# Patient Record
Sex: Female | Born: 1945 | Race: White | Hispanic: Refuse to answer | Marital: Married | Smoking: Never smoker
Health system: Southern US, Community
[De-identification: ages and names within clinical notes are randomized; demographics above are authoritative.]

## PROBLEM LIST (undated history)

## (undated) DIAGNOSIS — I10 Essential (primary) hypertension: Secondary | ICD-10-CM

## (undated) DIAGNOSIS — E119 Type 2 diabetes mellitus without complications: Secondary | ICD-10-CM

## (undated) DIAGNOSIS — M5432 Sciatica, left side: Secondary | ICD-10-CM

## (undated) DIAGNOSIS — H409 Unspecified glaucoma: Secondary | ICD-10-CM

## (undated) DIAGNOSIS — R32 Unspecified urinary incontinence: Secondary | ICD-10-CM

## (undated) DIAGNOSIS — K2270 Barrett's esophagus without dysplasia: Secondary | ICD-10-CM

## (undated) HISTORY — DX: Unspecified urinary incontinence: R32

## (undated) HISTORY — DX: Barrett's esophagus without dysplasia: K22.70

## (undated) HISTORY — DX: Unspecified glaucoma: H40.9

## (undated) HISTORY — DX: Sciatica, left side: M54.32

## (undated) HISTORY — PX: ABDOMINAL HYSTERECTOMY: SHX81

---

## 1986-08-17 DIAGNOSIS — G4700 Insomnia, unspecified: Secondary | ICD-10-CM | POA: Insufficient documentation

## 1988-08-17 DIAGNOSIS — I10 Essential (primary) hypertension: Secondary | ICD-10-CM | POA: Insufficient documentation

## 1992-08-17 HISTORY — PX: ABDOMINAL HYSTERECTOMY: SHX81

## 1997-12-07 ENCOUNTER — Other Ambulatory Visit: Admission: RE | Admit: 1997-12-07 | Discharge: 1997-12-07 | Payer: Self-pay | Admitting: Obstetrics and Gynecology

## 1998-08-17 DIAGNOSIS — E1169 Type 2 diabetes mellitus with other specified complication: Secondary | ICD-10-CM | POA: Insufficient documentation

## 1999-01-06 ENCOUNTER — Other Ambulatory Visit: Admission: RE | Admit: 1999-01-06 | Discharge: 1999-01-06 | Payer: Self-pay | Admitting: Obstetrics and Gynecology

## 1999-02-04 ENCOUNTER — Encounter: Admission: RE | Admit: 1999-02-04 | Discharge: 1999-05-05 | Payer: Self-pay | Admitting: Cardiology

## 1999-06-12 ENCOUNTER — Encounter: Payer: Self-pay | Admitting: Obstetrics and Gynecology

## 1999-06-12 ENCOUNTER — Encounter: Admission: RE | Admit: 1999-06-12 | Discharge: 1999-06-12 | Payer: Self-pay | Admitting: Obstetrics and Gynecology

## 1999-06-19 ENCOUNTER — Encounter: Payer: Self-pay | Admitting: Obstetrics and Gynecology

## 1999-06-19 ENCOUNTER — Encounter: Admission: RE | Admit: 1999-06-19 | Discharge: 1999-06-19 | Payer: Self-pay | Admitting: Obstetrics and Gynecology

## 2000-06-21 ENCOUNTER — Other Ambulatory Visit: Admission: RE | Admit: 2000-06-21 | Discharge: 2000-06-21 | Payer: Self-pay | Admitting: Obstetrics and Gynecology

## 2001-07-20 ENCOUNTER — Other Ambulatory Visit: Admission: RE | Admit: 2001-07-20 | Discharge: 2001-07-20 | Payer: Self-pay | Admitting: Obstetrics and Gynecology

## 2002-11-07 ENCOUNTER — Other Ambulatory Visit: Admission: RE | Admit: 2002-11-07 | Discharge: 2002-11-07 | Payer: Self-pay | Admitting: Obstetrics and Gynecology

## 2005-05-13 ENCOUNTER — Encounter: Admission: RE | Admit: 2005-05-13 | Discharge: 2005-05-13 | Payer: Self-pay | Admitting: Orthopedic Surgery

## 2010-06-17 ENCOUNTER — Encounter: Payer: Self-pay | Admitting: Internal Medicine

## 2010-06-27 ENCOUNTER — Telehealth: Payer: Self-pay | Admitting: Internal Medicine

## 2010-09-18 NOTE — Progress Notes (Signed)
Summary: pt has moved  Phone Note Call from Patient   Call For: Dr. Marina Goodell Summary of Call: pt now living in [place], [place] and thus will not be cont her GI here in [place]... no need to persue recall COL Initial call taken by: Vallarie Mare,  June 27, 2010 2:43 PM

## 2010-09-18 NOTE — Letter (Signed)
Summary: Colonoscopy Letter  [facility]  [address] [place], [place] [address]   Phone: [phone]  Fax: [phone]      June 17, 2010 MRN: [id number]   [facility] [address] [facility] [place], [place]  [address]   Dear Ms. Probasco,   According to your medical record, it is time for you to schedule a Colonoscopy. The American Cancer Society recommends this procedure as a method to detect early colon cancer. Patients with a family history of colon cancer, or a personal history of colon polyps or inflammatory bowel disease are at increased risk.  This letter has been generated based on the recommendations made at the time of your procedure. If you feel that in your particular situation this may no longer apply, please contact our office.  Please call our office at [phone] to schedule this appointment or to update your records at your earliest convenience.  Thank you for cooperating with [place] to provide you with the very best care possible.   Sincerely,  Wilhemina Bonito. Marina Goodell, M.D.  [facility] Gastroenterology Division [phone]

## 2012-05-03 ENCOUNTER — Encounter: Payer: Self-pay | Admitting: Internal Medicine

## 2012-08-17 DIAGNOSIS — M4800 Spinal stenosis, site unspecified: Secondary | ICD-10-CM | POA: Insufficient documentation

## 2012-08-17 DIAGNOSIS — M48062 Spinal stenosis, lumbar region with neurogenic claudication: Secondary | ICD-10-CM | POA: Insufficient documentation

## 2012-09-26 ENCOUNTER — Institutional Professional Consult (permissible substitution): Payer: Self-pay | Admitting: Cardiology

## 2012-10-29 ENCOUNTER — Emergency Department (HOSPITAL_COMMUNITY)
Admission: EM | Admit: 2012-10-29 | Discharge: 2012-10-29 | Disposition: A | Discharge status: Home / Self Care | Payer: Worker's Compensation | Attending: Emergency Medicine | Admitting: Emergency Medicine

## 2012-10-29 ENCOUNTER — Emergency Department (HOSPITAL_COMMUNITY): Payer: Worker's Compensation

## 2012-10-29 ENCOUNTER — Encounter (HOSPITAL_COMMUNITY): Payer: Self-pay | Admitting: Emergency Medicine

## 2012-10-29 DIAGNOSIS — W010XXA Fall on same level from slipping, tripping and stumbling without subsequent striking against object, initial encounter: Secondary | ICD-10-CM | POA: Insufficient documentation

## 2012-10-29 DIAGNOSIS — I10 Essential (primary) hypertension: Secondary | ICD-10-CM | POA: Insufficient documentation

## 2012-10-29 DIAGNOSIS — Z79899 Other long term (current) drug therapy: Secondary | ICD-10-CM | POA: Insufficient documentation

## 2012-10-29 DIAGNOSIS — S4980XA Other specified injuries of shoulder and upper arm, unspecified arm, initial encounter: Secondary | ICD-10-CM | POA: Insufficient documentation

## 2012-10-29 DIAGNOSIS — S46909A Unspecified injury of unspecified muscle, fascia and tendon at shoulder and upper arm level, unspecified arm, initial encounter: Secondary | ICD-10-CM | POA: Insufficient documentation

## 2012-10-29 DIAGNOSIS — S99919A Unspecified injury of unspecified ankle, initial encounter: Secondary | ICD-10-CM | POA: Insufficient documentation

## 2012-10-29 DIAGNOSIS — Y990 Civilian activity done for income or pay: Secondary | ICD-10-CM | POA: Insufficient documentation

## 2012-10-29 DIAGNOSIS — W1809XA Striking against other object with subsequent fall, initial encounter: Secondary | ICD-10-CM | POA: Insufficient documentation

## 2012-10-29 DIAGNOSIS — Y9289 Other specified places as the place of occurrence of the external cause: Secondary | ICD-10-CM | POA: Insufficient documentation

## 2012-10-29 DIAGNOSIS — S161XXA Strain of muscle, fascia and tendon at neck level, initial encounter: Secondary | ICD-10-CM

## 2012-10-29 DIAGNOSIS — E119 Type 2 diabetes mellitus without complications: Secondary | ICD-10-CM | POA: Insufficient documentation

## 2012-10-29 DIAGNOSIS — S139XXA Sprain of joints and ligaments of unspecified parts of neck, initial encounter: Secondary | ICD-10-CM | POA: Insufficient documentation

## 2012-10-29 DIAGNOSIS — Z8739 Personal history of other diseases of the musculoskeletal system and connective tissue: Secondary | ICD-10-CM | POA: Insufficient documentation

## 2012-10-29 DIAGNOSIS — S0180XA Unspecified open wound of other part of head, initial encounter: Secondary | ICD-10-CM | POA: Insufficient documentation

## 2012-10-29 DIAGNOSIS — Z87828 Personal history of other (healed) physical injury and trauma: Secondary | ICD-10-CM | POA: Insufficient documentation

## 2012-10-29 DIAGNOSIS — S0181XA Laceration without foreign body of other part of head, initial encounter: Secondary | ICD-10-CM

## 2012-10-29 DIAGNOSIS — Y939 Activity, unspecified: Secondary | ICD-10-CM | POA: Insufficient documentation

## 2012-10-29 DIAGNOSIS — S8990XA Unspecified injury of unspecified lower leg, initial encounter: Secondary | ICD-10-CM | POA: Insufficient documentation

## 2012-10-29 HISTORY — DX: Essential (primary) hypertension: I10

## 2012-10-29 HISTORY — DX: Type 2 diabetes mellitus without complications: E11.9

## 2012-10-29 LAB — RAPID URINE DRUG SCREEN, HOSP PERFORMED: Barbiturates: NOT DETECTED

## 2012-10-29 MED ORDER — NAPROXEN 500 MG PO TABS: 500.0000 mg | ORAL_TABLET | Freq: Two times a day (BID) | ORAL | Status: DC | PRN

## 2012-10-29 MED ORDER — METHOCARBAMOL 750 MG PO TABS: 750.0000 mg | ORAL_TABLET | Freq: Four times a day (QID) | ORAL | Status: DC | PRN

## 2012-10-29 MED ORDER — HYDROCODONE-ACETAMINOPHEN 5-325 MG PO TABS
2.0000 | ORAL_TABLET | Freq: Once | ORAL | Status: AC
  Administered 2012-10-29: 2 via ORAL
  Filled 2012-10-29: qty 2

## 2012-10-29 MED ORDER — HYDROCODONE-ACETAMINOPHEN 5-325 MG PO TABS: 1.0000 | ORAL_TABLET | Freq: Four times a day (QID) | ORAL | Status: DC | PRN

## 2012-10-29 NOTE — ED Notes (Addendum)
Pt reports trip and fall while at work today, hitting head on counter. Pt denies +LOC or blood thinner use. Pt AAOx4. Pt presents with small laceration above right eyebrow, no bleeding noted.
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
## 2012-10-29 NOTE — ED Notes (Signed)
Pt is not clear on events prior to fall. Pt thinks she tripped on someone' s foot but states she is not certain. Pt states remembering the fall but is unsure if LOC occurred. Pt denies any vision changes . Small lac over RT eye. Pt reports HA 6/10 and neck pain and other generalized pain.

## 2012-10-29 NOTE — ED Provider Notes (Signed)
Medical screening examination/treatment/procedure(s) were performed by non-physician practitioner and as supervising physician I was immediately available for consultation/collaboration.    Vida Roller, MD 10/29/12 [phone]

## 2012-10-30 NOTE — ED Notes (Signed)
Cervical collar placed

## 2013-06-22 ENCOUNTER — Other Ambulatory Visit: Payer: Self-pay

## 2013-07-27 IMAGING — CT CT MAXILLOFACIAL W/O CM
4 of 7 series · 17 of 40 positions shown, 19 images · non-contrast
Comparison: None.
COMPARISON: None

CLINICAL DATA: The patient tripped and fell at work today.  Hit
head on counter.  No loss of consciousness.  A small laceration
above right eyebrow.  Neck pain.

CT CERVICAL SPINE WITHOUT CONTRAST
TECHNIQUE: Multidetector CT imaging of the cervical spine was
performed without intravenous contrast.  Multiplanar CT image
reconstructions were also generated.
TECHNIQUE: Multidetector CT imaging of the maxillofacial
structures was performed.  Multiplanar CT image reconstructions
were also generated.  A small metallic BB was placed on the right
temple in order to reliably differentiate right from left.

[Series 6: recon 2: c-spine · axial · 0.38mm/px · z∈[-301,-184]mm · 5 of 71 slices shown]
[im 12/71  bone]
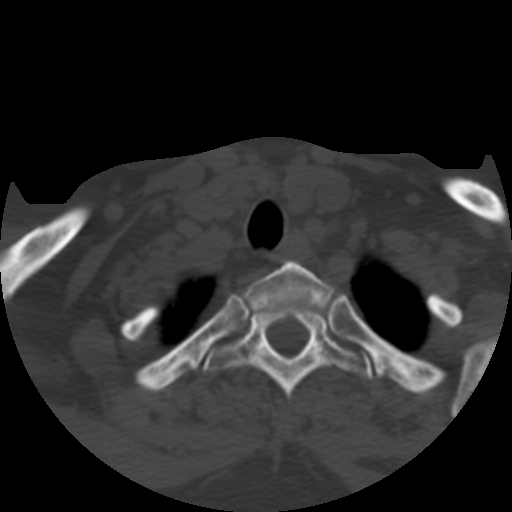
[im 24/71  bone]
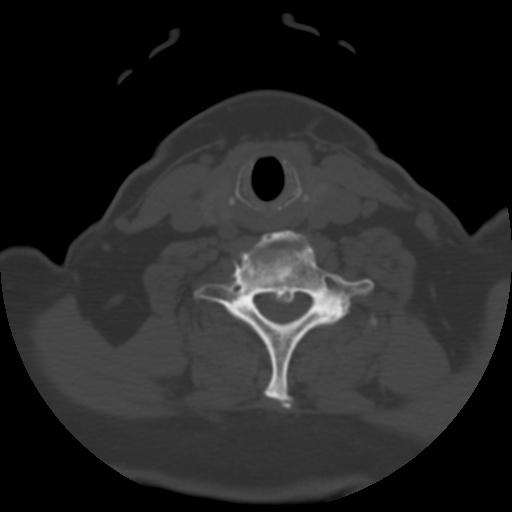
[im 36/71  bone]
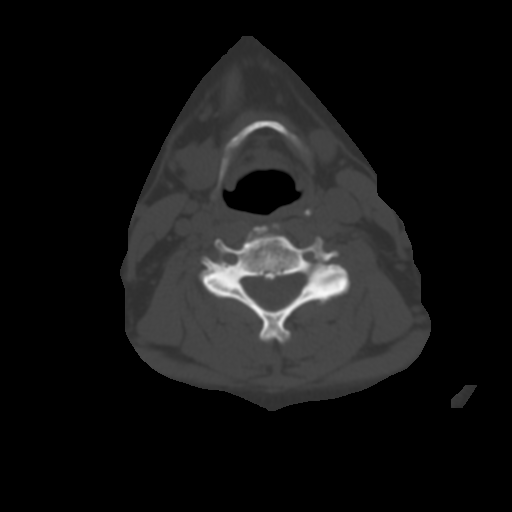
[im 47/71  bone]
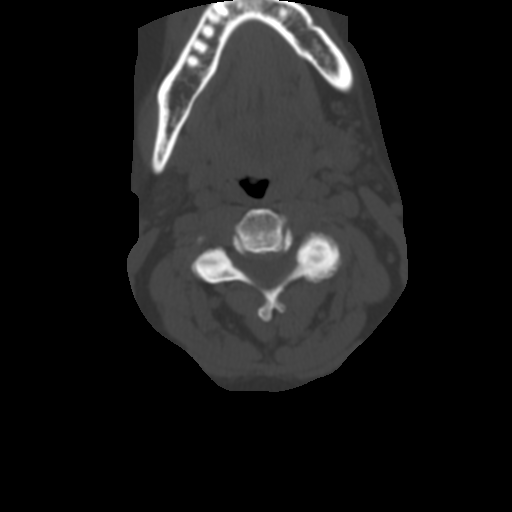
[im 59/71  bone]
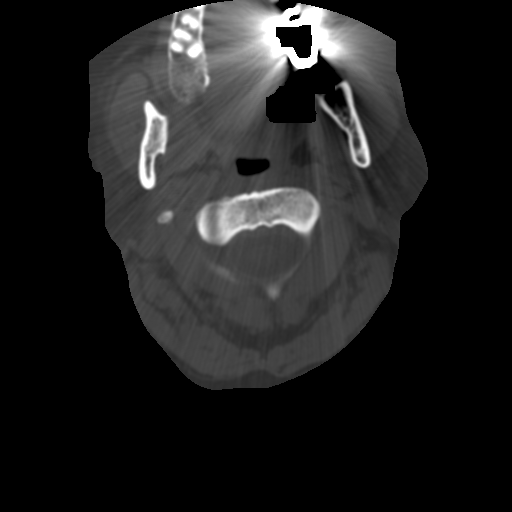

[Series 103: sagittals st · sagittal · 0.36mm/px · 2 of 54 slices shown]
[im 14/54  bone]
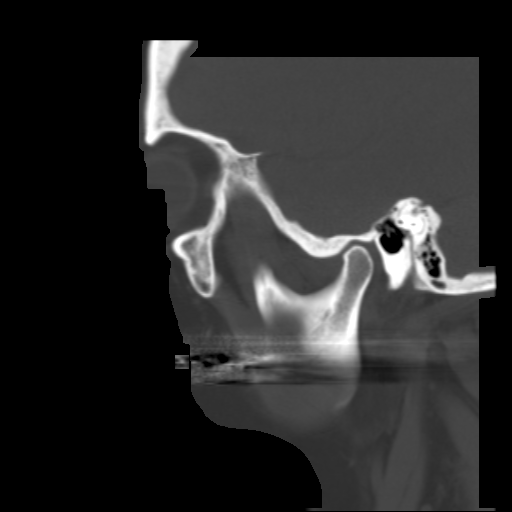
[im 27/54  bone]
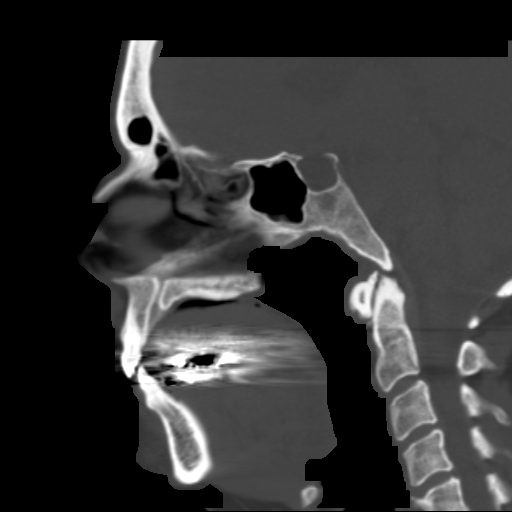

[Series 104: coronals st · coronal · 0.36mm/px · 3 of 54 slices shown]
[im 5/54  bone]
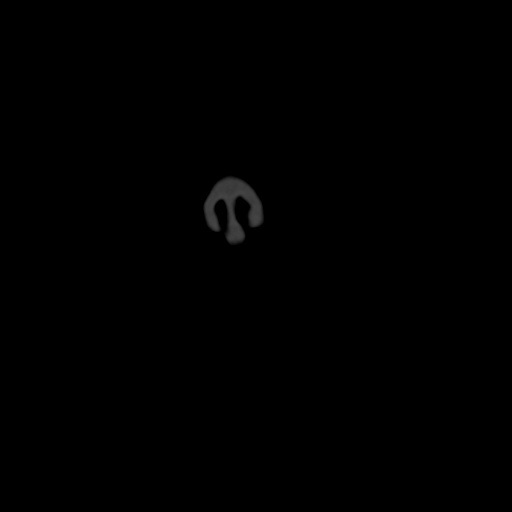
[im 10/54  bone]
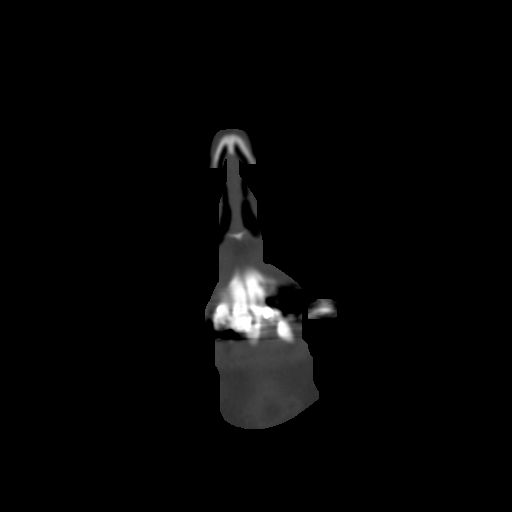
[im 14/54  bone]
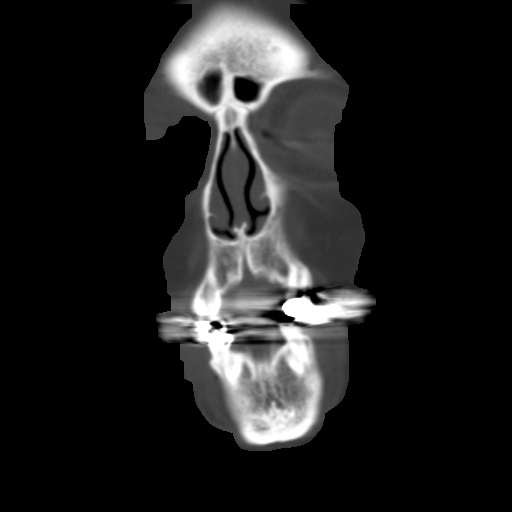

[Series 702: orthog · axial · 0.38mm/px · z∈[-334,-214]mm · 7 of 86 slices shown, 9 images]
[im 11/86  brain]
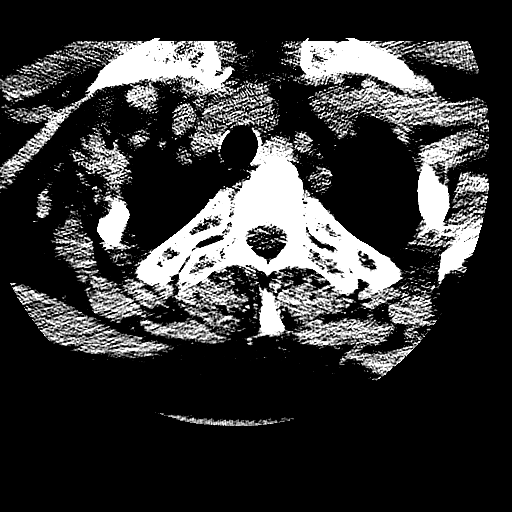
[im 11/86  bone]
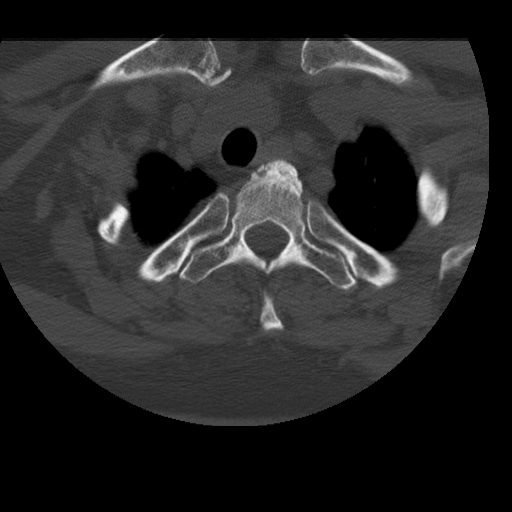
[im 22/86  bone]
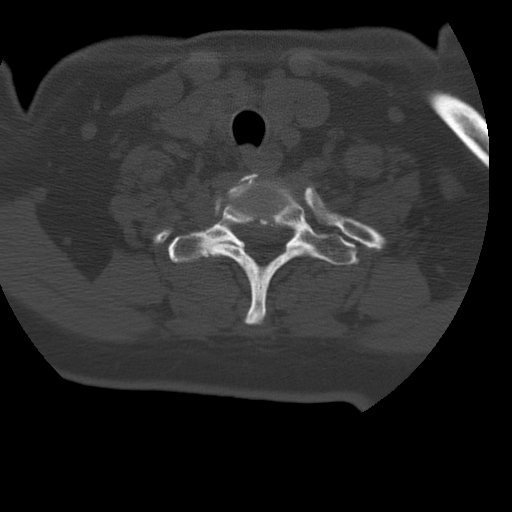
[im 32/86  bone]
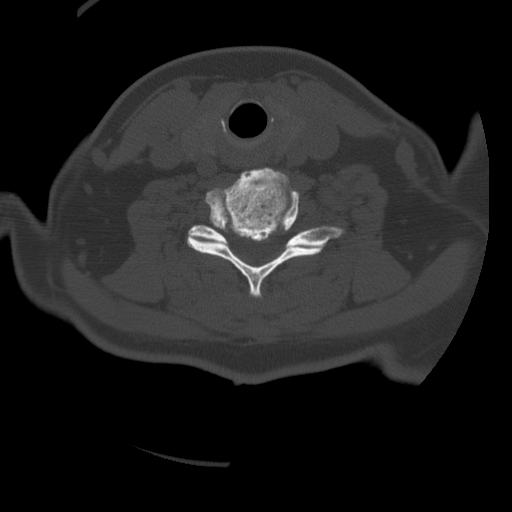
[im 43/86  bone]
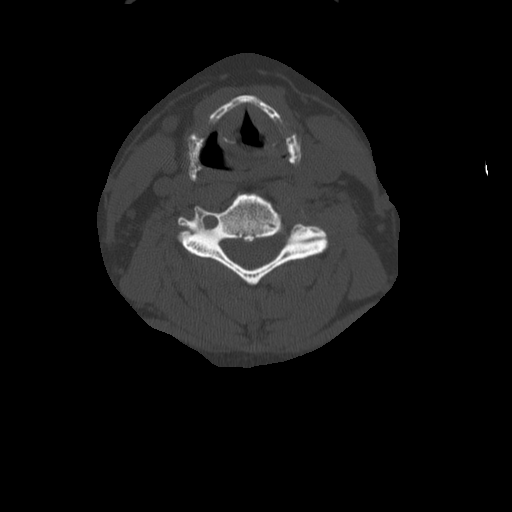
[im 54/86  brain]
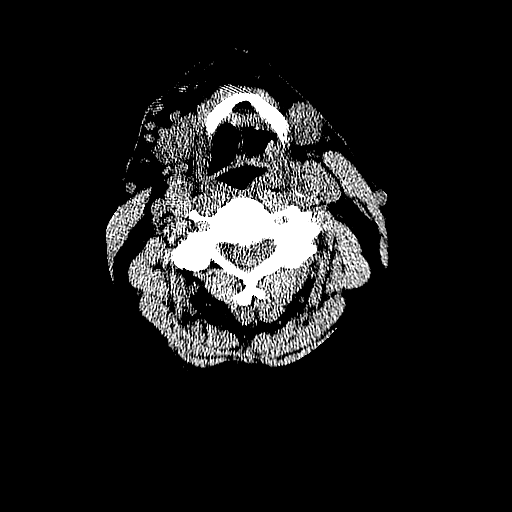
[im 54/86  bone]
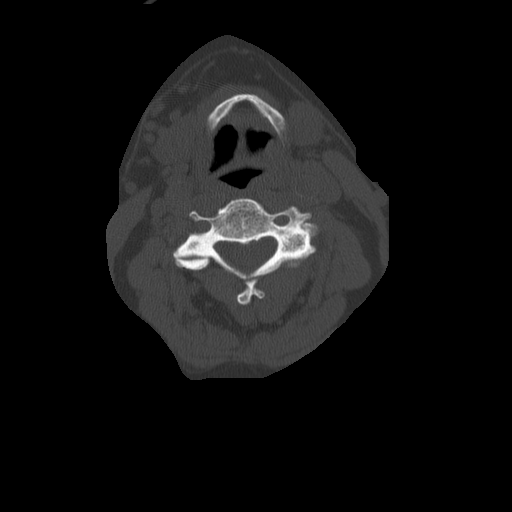
[im 64/86  bone]
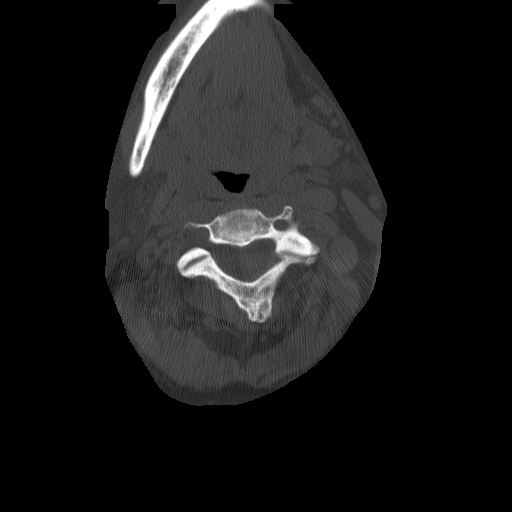
[im 75/86  bone]
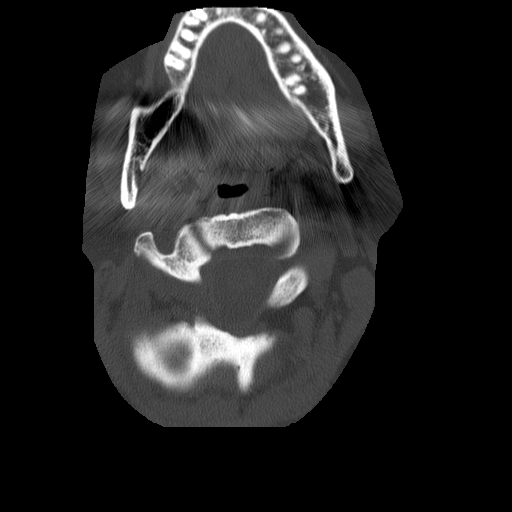

[17 of 40 positions shown; findings below may reference images not displayed]

FINDINGS: [There are degenerative changes in the cervical spine.
No evidence for acute fracture or subluxation.  There is moderate
vertebral canal narrowing at C6-7, lower lobe related to
significant degenerative change.  Bilateral foraminal narrowing is
identified at the same level.  Lung apices are clear. There is a
small bone density adjacent to the medial aspect of the right
clavicle, consistent with old injury or degenerative change.]
IMPRESSION: [

1.  Significant degenerative change with significant canal stenosis
at C6-7.

2.  No evidence for acute fracture or subluxation.]

CT MAXILLOFACIAL WITHOUT CONTRAST
FINDINGS: [The orbits, temporal mandibular joints, mandible,
pterygoid plates, zygomatic arches, nasal bones are intact.  The
globes are symmetric and intact bilaterally.  No significant pre or
post septal edema.  The visualized paranasal and mastoid air cells
are normally aerated.]
IMPRESSION: [No evidence for acute maxillofacial injury.]

## 2013-08-17 HISTORY — PX: BARIATRIC SURGERY: SHX1103

## 2014-06-01 ENCOUNTER — Other Ambulatory Visit: Payer: Self-pay

## 2015-02-11 ENCOUNTER — Other Ambulatory Visit: Payer: Self-pay

## 2015-12-23 DIAGNOSIS — Z9884 Bariatric surgery status: Secondary | ICD-10-CM | POA: Insufficient documentation

## 2017-08-27 DIAGNOSIS — A6000 Herpesviral infection of urogenital system, unspecified: Secondary | ICD-10-CM | POA: Insufficient documentation

## 2019-12-05 LAB — HM MAMMOGRAPHY

## 2020-02-07 DIAGNOSIS — F3342 Major depressive disorder, recurrent, in full remission: Secondary | ICD-10-CM | POA: Insufficient documentation

## 2020-12-25 DIAGNOSIS — E118 Type 2 diabetes mellitus with unspecified complications: Secondary | ICD-10-CM | POA: Insufficient documentation

## 2021-02-14 HISTORY — PX: TOTAL KNEE ARTHROPLASTY: SHX125

## 2021-04-07 LAB — HM COLONOSCOPY

## 2021-08-17 HISTORY — PX: UPPER GI ENDOSCOPY: SHX6162

## 2021-12-19 LAB — LIPID PANEL
Cholesterol: 143 (ref 0–200)
HDL: 62 (ref 35–70)
LDL Cholesterol: 68
Triglycerides: 65 (ref 40–160)

## 2021-12-19 LAB — HEMOGLOBIN A1C: Hemoglobin A1C: 5.8

## 2021-12-19 LAB — CBC AND DIFFERENTIAL
HCT: 40 (ref 36–46)
Hemoglobin: 12.8 (ref 12.0–16.0)
Platelets: 211 10*3/uL (ref 150–400)
WBC: 5

## 2021-12-19 LAB — BASIC METABOLIC PANEL: Glucose: 120

## 2021-12-19 LAB — CBC: RBC: 4.42 (ref 3.87–5.11)

## 2021-12-22 LAB — HM COLONOSCOPY

## 2021-12-24 DIAGNOSIS — K2270 Barrett's esophagus without dysplasia: Secondary | ICD-10-CM | POA: Insufficient documentation

## 2022-04-27 ENCOUNTER — Ambulatory Visit
Admission: RE | Admit: 2022-04-27 | Discharge: 2022-04-27 | Disposition: A | Discharge status: Home / Self Care | Payer: Medicare Other | Attending: Internal Medicine | Admitting: Internal Medicine

## 2022-04-27 ENCOUNTER — Encounter: Payer: Self-pay | Admitting: Internal Medicine

## 2022-04-27 ENCOUNTER — Ambulatory Visit
Admission: RE | Admit: 2022-04-27 | Discharge: 2022-04-27 | Disposition: A | Discharge status: Home / Self Care | Payer: Medicare Other | Source: Ambulatory Visit | Attending: Internal Medicine | Admitting: Internal Medicine

## 2022-04-27 ENCOUNTER — Ambulatory Visit (INDEPENDENT_AMBULATORY_CARE_PROVIDER_SITE_OTHER): Payer: Medicare Other | Admitting: Internal Medicine

## 2022-04-27 VITALS — BP 132/78 | HR 78 | Ht 66.0 in | Wt 167.2 lb

## 2022-04-27 DIAGNOSIS — M5432 Sciatica, left side: Secondary | ICD-10-CM | POA: Insufficient documentation

## 2022-04-27 DIAGNOSIS — R32 Unspecified urinary incontinence: Secondary | ICD-10-CM | POA: Insufficient documentation

## 2022-04-27 DIAGNOSIS — K22719 Barrett's esophagus with dysplasia, unspecified: Secondary | ICD-10-CM

## 2022-04-27 DIAGNOSIS — H401132 Primary open-angle glaucoma, bilateral, moderate stage: Secondary | ICD-10-CM | POA: Insufficient documentation

## 2022-04-27 DIAGNOSIS — E1169 Type 2 diabetes mellitus with other specified complication: Secondary | ICD-10-CM

## 2022-04-27 DIAGNOSIS — I10 Essential (primary) hypertension: Secondary | ICD-10-CM

## 2022-04-27 DIAGNOSIS — F324 Major depressive disorder, single episode, in partial remission: Secondary | ICD-10-CM

## 2022-04-27 DIAGNOSIS — E118 Type 2 diabetes mellitus with unspecified complications: Secondary | ICD-10-CM | POA: Diagnosis not present

## 2022-04-27 DIAGNOSIS — E785 Hyperlipidemia, unspecified: Secondary | ICD-10-CM

## 2022-04-27 NOTE — Progress Notes (Signed)
Date:  04/27/2022   Name:  Sheri Green   DOB:  [birth year]   MRN:  [id number]   Chief Complaint: New Patient (Initial Visit), Hypertension, Diabetes, and Sciatica (Left Side - started Last April. Has tried steroid tapers which only helped temporarily. )  Hypertension This is a chronic problem. The problem is controlled. Pertinent negatives include no chest pain, headaches, palpitations or shortness of breath. Past treatments include beta blockers. The current treatment provides significant improvement. There is no history of kidney disease, CAD/MI or CVA.  Diabetes She presents for her follow-up diabetic visit. She has type 2 diabetes mellitus. Her disease course has been stable. Hypoglycemia symptoms include nervousness/anxiousness. Pertinent negatives for hypoglycemia include no dizziness or headaches. Pertinent negatives for diabetes include no chest pain and no fatigue. Pertinent negatives for diabetic complications include no CVA. Current diabetic treatment includes oral agent (monotherapy) (metformin). She is compliant with treatment all of the time.  Depression        This is a chronic problem.  Onset quality: since losing a daughter age 33. The problem is unchanged.  Associated symptoms include no fatigue and no headaches.  Treatments tried: on bupropion then Lexapro was added, then Trazodone for sleep then Seroquel.  Compliance with treatment is good.  Previous treatment provided mild relief. Back Pain This is a chronic problem. The current episode started more than 1 year ago. The pain is present in the lumbar spine. The pain radiates to the left knee. The pain is mild. Pertinent negatives include no abdominal pain, chest pain, fever or headaches. Treatments tried: steroid taper. The treatment provided mild relief.  Gastroesophageal Reflux She complains of heartburn. She reports no abdominal pain, no chest pain or no wheezing. This is a recurrent problem. The problem occurs  occasionally. Pertinent negatives include no fatigue. Risk factors include Barrett's esophagus (just noted last year). She has tried a PPI for the symptoms. The treatment provided moderate relief. Past procedures include an EGD.   She reports that a benign appearing lesion was seen on her pancreas and on her kidney.  She does not know any specifics.   No results found for: "NA", "K", "CO2", "GLUCOSE", "BUN", "CREATININE", "CALCIUM", "EGFR", "GFRNONAA" No results found for: "CHOL", "HDL", "LDLCALC", "LDLDIRECT", "TRIG", "CHOLHDL" No results found for: "TSH" No results found for: "HGBA1C" No results found for: "WBC", "HGB", "HCT", "MCV", "PLT" No results found for: "ALT", "AST", "GGT", "ALKPHOS", "BILITOT" No results found for: "25OHVITD2", "25OHVITD3", "VD25OH"   Review of Systems  Constitutional:  Negative for chills, fatigue, fever and unexpected weight change.  HENT:  Negative for trouble swallowing.   Respiratory:  Negative for chest tightness, shortness of breath and wheezing.   Cardiovascular:  Negative for chest pain, palpitations and leg swelling.  Gastrointestinal:  Positive for heartburn. Negative for abdominal pain, constipation and diarrhea.  Musculoskeletal:  Positive for back pain.  Skin:  Negative for rash.  Neurological:  Negative for dizziness, light-headedness and headaches.  Psychiatric/Behavioral:  Positive for depression, dysphoric mood and sleep disturbance. The patient is nervous/anxious.     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   Spinal stenosis 08/17/2012    Hyperlipidemia associated with type 2 diabetes mellitus ([place]) 08/17/1998   Essential hypertension 08/17/1988   Insomnia 08/17/1986    Allergies  Allergen Reactions   Diltiazem Hives    Past Surgical History:  Procedure Laterality Date   ABDOMINAL HYSTERECTOMY  1994   BARIATRIC SURGERY  2015   gastric sleeve   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   UPPER GI ENDOSCOPY  2023    Social History   Tobacco Use   Smoking status: Never   Smokeless tobacco: Never  Substance Use Topics   Alcohol use: Yes    Comment: rare   Drug use: No     Medication list has been reviewed and updated.  Current Meds  Medication Sig   atenolol (TENORMIN) 25 MG tablet Take 25 mg by mouth daily.   atorvastatin (LIPITOR) 40 MG tablet Take 40 mg by mouth daily.   buPROPion (WELLBUTRIN XL) 300 MG 24 hr tablet Take 300 mg by mouth daily.   escitalopram (LEXAPRO) 10 MG tablet Take 10 mg by mouth daily.   latanoprost (XALATAN) 0.005 % ophthalmic solution 1 drop at bedtime.   metFORMIN (GLUCOPHAGE) 500 MG tablet Take 500 mg by mouth 2 (two) times daily with a meal.   pantoprazole (PROTONIX) 40 MG tablet Take 40 mg by mouth daily.   QUEtiapine (SEROQUEL) 25 MG tablet Take 25 mg by mouth at bedtime. As needed.   traZODone (DESYREL) 50 MG tablet Take 50 mg by mouth at bedtime.   UNABLE TO FIND Med Name: Nature's Bounty Vegitable and Fruit Supplement   [DISCONTINUED] esomeprazole (NEXIUM) 20 MG capsule Take 20 mg by mouth daily before breakfast.   [DISCONTINUED] rosuvastatin (CRESTOR) 20 MG tablet Take 20 mg by mouth daily.       04/27/2022    2:52 PM  GAD 7 : Generalized Anxiety Score  Nervous, Anxious, on Edge 2  Control/stop worrying 2  Worry too much - different things 1  Trouble relaxing 3  Restless 1  Easily annoyed or irritable 1  Afraid - awful might happen 0  Total GAD 7 Score 10  Anxiety Difficulty Somewhat difficult       04/27/2022    2:51 PM  Depression screen PHQ 2/9  Decreased  Interest 0  Down, Depressed, Hopeless 1  PHQ - 2 Score 1  Altered sleeping 3  Tired, decreased energy 3  Change in appetite 1  Feeling bad or failure about yourself  0  Trouble concentrating 1  Moving slowly or fidgety/restless 0  Suicidal thoughts 0  PHQ-9 Score 9  Difficult doing work/chores Somewhat difficult    BP Readings from Last 3 Encounters:  04/27/22 132/78  10/29/12 138/83    Physical Exam Vitals and nursing note reviewed.  Constitutional:      General: She is not in acute distress.    Appearance: Normal appearance. She is well-developed.  HENT:     Head: Normocephalic and atraumatic.  Neck:     Vascular: No carotid bruit.  Cardiovascular:     Rate and Rhythm: Normal rate and regular rhythm.     Pulses: Normal pulses.     Heart sounds: No murmur heard. Pulmonary:     Effort: Pulmonary effort is normal. No respiratory distress.     Breath sounds: No wheezing or rhonchi.  Musculoskeletal:     Cervical back: Normal range of motion.     Lumbar back: No tenderness or bony tenderness. Normal range  of motion. Positive left straight leg raise test. Negative right straight leg raise test.     Right hip: Normal.     Left hip: Normal.     Right knee: No effusion or erythema.     Left knee: No effusion or erythema.     Right lower leg: No edema.     Left lower leg: No edema.  Lymphadenopathy:     Cervical: No cervical adenopathy.  Skin:    General: Skin is warm and dry.     Capillary Refill: Capillary refill takes less than 2 seconds.     Findings: No rash.  Neurological:     General: No focal deficit present.     Mental Status: She is alert and oriented to person, place, and time.     Sensory: Sensation is intact.     Motor: Motor function is intact.     Deep Tendon Reflexes: Reflexes are normal and symmetric.  Psychiatric:        Mood and Affect: Mood normal.        Behavior: Behavior normal.     Wt Readings from Last 3 Encounters:  04/27/22 167 lb 3.2  oz (75.8 kg)    BP 132/78   Pulse 78   Ht $R'5\' 6"'HC$  (1.676 m)   Wt 167 lb 3.2 oz (75.8 kg)   SpO2 98%   BMI 26.99 kg/m   Assessment and Plan: 1. Essential hypertension Clinically stable exam with well controlled BP. Tolerating medications without side effects at this time. Pt to continue current regimen and low sodium diet; benefits of regular exercise as able discussed. - CBC with Differential/Platelet - TSH  2. Sciatica of left side Without red flag symptoms  Did not respond to oral steroids Needs imaging and then referral to Ortho or SM - DG Lumbar Spine Complete  3. Type II diabetes mellitus with complication [facility]) Patient does not check BS. She is compliant with medications. Foot exam is normal. Will continue current therapy. - Comprehensive metabolic panel - Hemoglobin A1c  4. Hyperlipidemia associated with type 2 diabetes mellitus (HCC) Continue statin. - Lipid panel  5. Barrett's esophagus with dysplasia Continue daily PPI Refer to GI for ongoing monitoring. - Ambulatory referral to Gastroenterology  6. Major depressive disorder with single episode, in partial remission ([place]) Recommend continuing current therapy with possible tapering off of bupropion in the near future. Would stop Seroquel completely but she does get benefit from using a half dose PRN   Partially dictated using Editor, commissioning. Any errors are unintentional.  Halina Maidens, MD [facility]  04/27/2022

## 2022-04-29 LAB — CBC WITH DIFFERENTIAL/PLATELET
Basophils Absolute: 0 10*3/uL (ref 0.0–0.2)
Basos: 1 %
EOS (ABSOLUTE): 0.2 10*3/uL (ref 0.0–0.4)
Eos: 4 %
Hematocrit: 38.2 % (ref 34.0–46.6)
Hemoglobin: 12.3 g/dL (ref 11.1–15.9)
Immature Grans (Abs): 0 10*3/uL (ref 0.0–0.1)
Immature Granulocytes: 0 %
Lymphocytes Absolute: 1.8 10*3/uL (ref 0.7–3.1)
Lymphs: 42 %
MCH: 28.4 pg (ref 26.6–33.0)
MCHC: 32.2 g/dL (ref 31.5–35.7)
MCV: 88 fL (ref 79–97)
Monocytes Absolute: 0.4 10*3/uL (ref 0.1–0.9)
Monocytes: 9 %
Neutrophils Absolute: 1.9 10*3/uL (ref 1.4–7.0)
Neutrophils: 44 %
Platelets: 227 10*3/uL (ref 150–450)
RBC: 4.33 x10E6/uL (ref 3.77–5.28)
RDW: 12.1 % (ref 11.7–15.4)
WBC: 4.3 10*3/uL (ref 3.4–10.8)

## 2022-04-29 LAB — LIPID PANEL
Chol/HDL Ratio: 2.4 ratio (ref 0.0–4.4)
Cholesterol, Total: 161 mg/dL (ref 100–199)
HDL: 66 mg/dL (ref >39)
LDL Chol Calc (NIH): 75 mg/dL (ref 0–99)
Triglycerides: 113 mg/dL (ref 0–149)
VLDL Cholesterol Cal: 20 mg/dL (ref 5–40)

## 2022-04-29 LAB — COMPREHENSIVE METABOLIC PANEL
ALT: 13 IU/L (ref 0–32)
AST: 17 IU/L (ref 0–40)
Albumin/Globulin Ratio: 2.8 — ABNORMAL HIGH (ref 1.2–2.2)
Albumin: 4.5 g/dL (ref 3.8–4.8)
Alkaline Phosphatase: 59 IU/L (ref 44–121)
BUN/Creatinine Ratio: 24 (ref 12–28)
BUN: 19 mg/dL (ref 8–27)
Bilirubin Total: 0.5 mg/dL (ref 0.0–1.2)
CO2: 22 mmol/L (ref 20–29)
Calcium: 9.7 mg/dL (ref 8.7–10.3)
Chloride: 104 mmol/L (ref 96–106)
Creatinine, Ser: 0.8 mg/dL (ref 0.57–1.00)
Globulin, Total: 1.6 g/dL (ref 1.5–4.5)
Glucose: 117 mg/dL — ABNORMAL HIGH (ref 70–99)
Potassium: 4.4 mmol/L (ref 3.5–5.2)
Sodium: 144 mmol/L (ref 134–144)
Total Protein: 6.1 g/dL (ref 6.0–8.5)
eGFR: 78 mL/min/{1.73_m2} (ref >59)

## 2022-04-29 LAB — HEMOGLOBIN A1C
Est. average glucose Bld gHb Est-mCnc: 134 mg/dL
Hgb A1c MFr Bld: 6.3 % — ABNORMAL HIGH (ref 4.8–5.6)

## 2022-04-29 LAB — TSH: TSH: 3.02 u[IU]/mL (ref 0.450–4.500)

## 2022-05-04 ENCOUNTER — Encounter: Payer: Self-pay | Admitting: Internal Medicine

## 2022-06-17 ENCOUNTER — Other Ambulatory Visit: Payer: Self-pay

## 2022-06-17 DIAGNOSIS — I10 Essential (primary) hypertension: Secondary | ICD-10-CM

## 2022-06-17 DIAGNOSIS — E1169 Type 2 diabetes mellitus with other specified complication: Secondary | ICD-10-CM

## 2022-06-17 MED ORDER — ATENOLOL 25 MG PO TABS: 25.0000 mg | ORAL_TABLET | Freq: Every day | ORAL | 0 refills | Status: DC

## 2022-06-17 MED ORDER — ATORVASTATIN CALCIUM 40 MG PO TABS: 40.0000 mg | ORAL_TABLET | Freq: Every day | ORAL | 0 refills | Status: DC

## 2022-06-30 ENCOUNTER — Other Ambulatory Visit: Payer: Self-pay | Admitting: Internal Medicine

## 2022-06-30 NOTE — Telephone Encounter (Signed)
Medication Refill - Medication: escitalopram (LEXAPRO) 10 MG tablet , metFORMIN (GLUCOPHAGE) 500 MG tablet, buPROPion (WELLBUTRIN XL) 300 MG 24 hr tablet   Has the patient contacted their pharmacy? Yes.     Preferred Pharmacy (with phone number or street name):  [facility] - [place], [place] - [address] Phone: [phone]  Fax: [phone]     Has the patient been seen for an appointment in the last year OR does the patient have an upcoming appointment? Yes.    The patient uses mail order and is running low on theses meds. Please assist patient further

## 2022-07-01 ENCOUNTER — Telehealth: Payer: Self-pay | Admitting: Internal Medicine

## 2022-07-01 MED ORDER — BUPROPION HCL ER (XL) 300 MG PO TB24: 300.0000 mg | ORAL_TABLET | Freq: Every day | ORAL | 0 refills | Status: DC

## 2022-07-01 MED ORDER — METFORMIN HCL 500 MG PO TABS: 500.0000 mg | ORAL_TABLET | Freq: Two times a day (BID) | ORAL | 0 refills | Status: DC

## 2022-07-01 MED ORDER — ESCITALOPRAM OXALATE 10 MG PO TABS: 10.0000 mg | ORAL_TABLET | Freq: Every day | ORAL | 0 refills | Status: DC

## 2022-07-01 NOTE — Telephone Encounter (Signed)
Spoke with patient she declined AWV do not call  

## 2022-07-01 NOTE — Telephone Encounter (Signed)
Requested medication (s) are due for refill today: For review. Amounts not specified  Requested medication (s) are on the active medication list: yes,all    Last refill: Escitalopram 04/27/22    Bupropion  10/29/12    Metformin  10/29/12  Future visit scheduled yes 08/27/22  Notes to clinic:All meds by Historical Provider.. All meds on current med profile. PLease review. Thank you.  Requested Prescriptions  Pending Prescriptions Disp Refills   escitalopram (LEXAPRO) 10 MG tablet      Sig: Take 1 tablet (10 mg total) by mouth daily.     Psychiatry:  Antidepressants - SSRI Passed - 06/30/2022  4:29 PM      Passed - Valid encounter within last 6 months    Recent Outpatient Visits           2 months ago Essential hypertension   [place] [facility] at [facility], Jesse Sans, MD       Future Appointments             In 1 month Glean Hess, MD [facility] at [facility]             buPROPion (WELLBUTRIN XL) 300 MG 24 hr tablet      Sig: Take 1 tablet (300 mg total) by mouth daily.     Psychiatry: Antidepressants - bupropion Passed - 06/30/2022  4:29 PM      Passed - Cr in normal range and within 360 days    Creatinine, Ser  Date Value Ref Range Status  04/28/2022 0.80 0.57 - 1.00 mg/dL Final         Passed - AST in normal range and within 360 days    AST  Date Value Ref Range Status  04/28/2022 17 0 - 40 IU/L Final         Passed - ALT in normal range and within 360 days    ALT  Date Value Ref Range Status  04/28/2022 13 0 - 32 IU/L Final         Passed - Last BP in normal range    BP Readings from Last 1 Encounters:  04/27/22 132/78         Passed - Valid encounter within last 6 months    Recent Outpatient Visits           2 months ago Essential hypertension   [place] [facility] at [facility], Jesse Sans, MD       Future  Appointments             In 1 month Army Melia, Jesse Sans, MD [facility] at [place], [facility]             metFORMIN (GLUCOPHAGE) 500 MG tablet      Sig: Take 1 tablet (500 mg total) by mouth 2 (two) times daily with a meal.     Endocrinology:  Diabetes - Biguanides Failed - 06/30/2022  4:29 PM      Failed - B12 Level in normal range and within 720 days    No results found for: "VITAMINB12"       Passed - Cr in normal range and within 360 days    Creatinine, Ser  Date Value Ref Range Status  04/28/2022 0.80 0.57 - 1.00 mg/dL Final         Passed - HBA1C is between 0  and 7.9 and within 180 days    Hemoglobin A1C  Date Value Ref Range Status  12/19/2021 5.8  Final   Hgb A1c MFr Bld  Date Value Ref Range Status  04/28/2022 6.3 (H) 4.8 - 5.6 % Final    Comment:             Prediabetes: 5.7 - 6.4          Diabetes: >6.4          Glycemic control for adults with diabetes: <7.0          Passed - eGFR in normal range and within 360 days    eGFR  Date Value Ref Range Status  04/28/2022 78 >59 mL/min/1.73 Final         Passed - Valid encounter within last 6 months    Recent Outpatient Visits           2 months ago Essential hypertension   [place] [facility] at [facility], Jesse Sans, MD       Future Appointments             In 1 month Army Melia Jesse Sans, MD [facility] and [facility] Medicine at [facility] [place] within normal limits and completed in the last 12 months    WBC  Date Value Ref Range Status  04/28/2022 4.3 3.4 - 10.8 x10E3/uL Final   RBC  Date Value Ref Range Status  04/28/2022 4.33 3.77 - 5.28 x10E6/uL Final  12/19/2021 4.42 3.87 - 5.11 Final   Hemoglobin  Date Value Ref Range Status  04/28/2022 12.3 11.1 - 15.9 g/dL Final   Hematocrit  Date Value Ref Range Status  04/28/2022 38.2 34.0 - 46.6 % Final   MCHC  Date Value Ref  Range Status  04/28/2022 32.2 31.5 - 35.7 g/dL Final   [facility]  Date Value Ref Range Status  04/28/2022 28.4 26.6 - 33.0 pg Final   MCV  Date Value Ref Range Status  04/28/2022 88 79 - 97 fL Final   No results found for: "PLTCOUNTKUC", "LABPLAT", "POCPLA" RDW  Date Value Ref Range Status  04/28/2022 12.1 11.7 - 15.4 % Final

## 2022-07-28 ENCOUNTER — Other Ambulatory Visit: Payer: Self-pay | Admitting: Internal Medicine

## 2022-07-29 ENCOUNTER — Encounter: Payer: Self-pay | Admitting: Gastroenterology

## 2022-07-29 ENCOUNTER — Ambulatory Visit (INDEPENDENT_AMBULATORY_CARE_PROVIDER_SITE_OTHER): Payer: Medicare Other | Admitting: Gastroenterology

## 2022-07-29 VITALS — BP 121/76 | HR 77 | Temp 98.1°F | Ht 66.0 in | Wt 169.0 lb

## 2022-07-29 DIAGNOSIS — K219 Gastro-esophageal reflux disease without esophagitis: Secondary | ICD-10-CM | POA: Diagnosis not present

## 2022-07-29 DIAGNOSIS — K2270 Barrett's esophagus without dysplasia: Secondary | ICD-10-CM | POA: Diagnosis not present

## 2022-07-29 NOTE — Progress Notes (Signed)
Gastroenterology Consultation  Referring Provider:     Reubin Milan, MD Primary Care Physician:  Sheri Milan, MD Primary Gastroenterologist:  Sheri Green     Reason for Consultation:     History of Barrett's esophagus        HPI:   Sheri Green is a 76 y.o. y/o female referred for consultation & management of history of Barrett's esophagus by Sheri Green, Sheri Cowden, MD. This patient comes to see me today after being seen by Sheri Green back in 2011 and a report that she was being seen in [facility] by GI.  The patient was seen by her primary care provider with the report of heartburn.  She was denying any abdominal pain but had stated the heartburn was recurring.  The patient had been tried on a PPI with moderate relief and states that she had a upper endoscopy in the past.  The EGD was in [place] after having diarrhea nausea also had an ultrasound. She had this a ear ago with a normal colonoscopy. She says she was told that she may have Barrett's and that she should see a GI to see if she has Barrett's. She had a gastric sleeve years ago. The patient reports that her heartburn got significantly better after the surgery.  She is now taking a PPI daily do to the recommendations of her previous gastrologist.   Past Medical History:  Diagnosis Date   Barrett's esophagus determined by endoscopy    Diabetes mellitus without complication (HCC)    Glaucoma    Hypertension    Sciatica of left side    Urinary incontinence     Past Surgical History:  Procedure Laterality Date   ABDOMINAL HYSTERECTOMY  1994   BARIATRIC SURGERY  2015   gastric sleeve   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   UPPER GI ENDOSCOPY  2023    Prior to Admission medications   Medication Sig Start Date End Date Taking? Authorizing Provider  atenolol (TENORMIN) 25 MG tablet Take 1 tablet (25 mg total) by mouth daily. 06/17/22   Sheri Milan, MD  atorvastatin (LIPITOR) 40 MG tablet Take 1 tablet (40 mg  total) by mouth daily. 06/17/22   Sheri Milan, MD  buPROPion (WELLBUTRIN XL) 300 MG 24 hr tablet Take 1 tablet (300 mg total) by mouth daily. 07/01/22   Sheri Milan, MD  escitalopram (LEXAPRO) 10 MG tablet TAKE 1 TABLET BY MOUTH DAILY 07/28/22   Sheri Milan, MD  latanoprost (XALATAN) 0.005 % ophthalmic solution 1 drop at bedtime.    [provider]  metFORMIN (GLUCOPHAGE) 500 MG tablet TAKE 1 TABLET BY MOUTH TWICE  DAILY WITH MEALS 07/28/22   Sheri Milan, MD  pantoprazole (PROTONIX) 40 MG tablet Take 40 mg by mouth daily.    [provider]  QUEtiapine (SEROQUEL) 25 MG tablet Take 25 mg by mouth at bedtime. As needed.    [provider]  traZODone (DESYREL) 50 MG tablet Take 50 mg by mouth at bedtime.    [provider]  UNABLE TO FIND Med Name: Nature's Bounty Vegitable and Fruit Supplement    [provider]    Family History  Problem Relation Age of Onset   Bladder Cancer Mother    Heart disease Father    Glaucoma Father    Hypertension Father    Heart attack Father    Hyperlipidemia Sister    Hypertension Brother  Social History   Tobacco Use   Smoking status: Never   Smokeless tobacco: Never  Substance Use Topics   Alcohol use: Yes    Comment: rare   Drug use: No    Allergies as of 07/29/2022 - Review Complete 05/01/2022  Allergen Reaction Noted   Diltiazem Hives 04/27/2022    Review of Systems:    All systems reviewed and negative except where noted in HPI.   Physical Exam:  There were no vitals taken for this visit. No LMP recorded. Patient has had a hysterectomy. General:   Alert,  Well-developed, well-nourished, pleasant and cooperative in NAD Head:  Normocephalic and atraumatic. Eyes:  Sclera clear, no icterus.   Conjunctiva pink. Ears:  Normal auditory acuity. Neck:  Supple; no masses or thyromegaly. Lungs:  Respirations even and unlabored.  Clear throughout to auscultation.   No  wheezes, crackles, or rhonchi. No acute distress. Heart:  Regular rate and rhythm; no murmurs, clicks, rubs, or gallops. Abdomen:  Normal bowel sounds.  No bruits.  Soft, non-tender and non-distended without masses, hepatosplenomegaly or hernias noted.  No guarding or rebound tenderness.  Negative Carnett sign.   Rectal:  Deferred.  Pulses:  Normal pulses noted. Extremities:  No clubbing or edema.  No cyanosis. Neurologic:  Alert and oriented x3;  grossly normal neurologically. Skin:  Intact without significant lesions or rashes.  No jaundice. Lymph Nodes:  No significant cervical adenopathy. Psych:  Alert and cooperative. Normal mood and affect.  Imaging Studies: No results found.  Assessment and Plan:   Sheri Green is a 76 y.o. y/o female who comes in today with a history of seen a gastrologist in the past with a colonoscopy year ago and reports that the colonoscopy was normal.  The patient now reports that she has been told that she had that esophagus or possible he had Barrett's esophagus and should follow-up with a gastroenterologist when she moved to this area. There ae no records available at this time and there is a question on the referral and her chart about whether or not there was dysplasia.  The patient will be set up for an upper endoscopy.  The patient also reports that she has a runny nose that she states happens usually in the evening and she is not sure if is related to reflux.  The patient has been told to take her PPI in the evening and see if this any change in the symptoms.  The patient will also try to obtain her past records from her previous GI doctor The patient has been explained the plan and agrees with it.    Sheri Minium, MD. Sheri Green    Note: This dictation was prepared with Dragon dictation along with smaller phrase technology. Any transcriptional errors that result from this process are unintentional.

## 2022-07-29 NOTE — Addendum Note (Signed)
Addended by: Roena Malady on: 07/29/2022 01:22 PM   Modules accepted: Orders

## 2022-08-25 ENCOUNTER — Encounter: Payer: Self-pay | Admitting: Internal Medicine

## 2022-08-25 ENCOUNTER — Ambulatory Visit (INDEPENDENT_AMBULATORY_CARE_PROVIDER_SITE_OTHER): Payer: Medicare Other | Admitting: Internal Medicine

## 2022-08-25 ENCOUNTER — Other Ambulatory Visit: Payer: Self-pay | Admitting: Internal Medicine

## 2022-08-25 VITALS — BP 122/70 | HR 88 | Ht 66.0 in | Wt 173.4 lb

## 2022-08-25 DIAGNOSIS — I10 Essential (primary) hypertension: Secondary | ICD-10-CM

## 2022-08-25 DIAGNOSIS — Z1159 Encounter for screening for other viral diseases: Secondary | ICD-10-CM | POA: Diagnosis not present

## 2022-08-25 DIAGNOSIS — G8929 Other chronic pain: Secondary | ICD-10-CM

## 2022-08-25 DIAGNOSIS — E118 Type 2 diabetes mellitus with unspecified complications: Secondary | ICD-10-CM | POA: Diagnosis not present

## 2022-08-25 DIAGNOSIS — M545 Low back pain, unspecified: Secondary | ICD-10-CM

## 2022-08-25 DIAGNOSIS — Z1231 Encounter for screening mammogram for malignant neoplasm of breast: Secondary | ICD-10-CM | POA: Diagnosis not present

## 2022-08-25 NOTE — Patient Instructions (Signed)
Call [facility] to schedule your mammogram at [phone].  

## 2022-08-25 NOTE — Assessment & Plan Note (Signed)
Clinically stable exam with well controlled BP. Tolerating medications without side effects at this time. Pt to continue current regimen and low sodium diet; benefits of regular exercise as able discussed.  

## 2022-08-25 NOTE — Progress Notes (Signed)
Date:  08/25/2022   Name:  Sheri Green   DOB:  [birth year]   MRN:  [id number]   Chief Complaint: Hypertension and Diabetes  Hypertension This is a chronic problem. The problem is controlled. Pertinent negatives include no chest pain, headaches, palpitations or shortness of breath. Past treatments include beta blockers. The current treatment provides significant improvement.  Diabetes She presents for her follow-up diabetic visit. She has type 2 diabetes mellitus. Her disease course has been stable. Pertinent negatives for hypoglycemia include no headaches or tremors. Pertinent negatives for diabetes include no chest pain, no fatigue, no polydipsia and no polyuria. Current diabetic treatment includes oral agent (monotherapy) (metformin).  Back Pain This is a chronic problem. The problem is unchanged. The pain is present in the lumbar spine. The quality of the pain is described as aching and cramping. The pain does not radiate. The pain is moderate. Pertinent negatives include no abdominal pain, chest pain, dysuria, fever, headaches or numbness. Risk factors: TKA and gait change. Treatments tried: most benefit from pain patches.    Lab Results  Component Value Date   NA 144 04/28/2022   K 4.4 04/28/2022   CO2 22 04/28/2022   GLUCOSE 117 (H) 04/28/2022   BUN 19 04/28/2022   CREATININE 0.80 04/28/2022   CALCIUM 9.7 04/28/2022   EGFR 78 04/28/2022   Lab Results  Component Value Date   CHOL 161 04/28/2022   HDL 66 04/28/2022   LDLCALC 75 04/28/2022   TRIG 113 04/28/2022   CHOLHDL 2.4 04/28/2022   Lab Results  Component Value Date   TSH 3.020 04/28/2022   Lab Results  Component Value Date   HGBA1C 6.3 (H) 04/28/2022   Lab Results  Component Value Date   WBC 4.3 04/28/2022   HGB 12.3 04/28/2022   HCT 38.2 04/28/2022   MCV 88 04/28/2022   PLT 227 04/28/2022   Lab Results  Component Value Date   ALT 13 04/28/2022   AST 17 04/28/2022   ALKPHOS 59 04/28/2022   BILITOT  0.5 04/28/2022   No results found for: "25OHVITD2", "25OHVITD3", "VD25OH"   Review of Systems  Constitutional:  Negative for appetite change, fatigue, fever and unexpected weight change.  HENT:  Negative for tinnitus and trouble swallowing.   Eyes:  Negative for visual disturbance.  Respiratory:  Negative for cough, chest tightness and shortness of breath.   Cardiovascular:  Negative for chest pain, palpitations and leg swelling.  Gastrointestinal:  Negative for abdominal pain.  Endocrine: Negative for polydipsia and polyuria.  Genitourinary:  Negative for dysuria and hematuria.  Musculoskeletal:  Positive for back pain. Negative for arthralgias.  Neurological:  Negative for tremors, numbness and headaches.  Psychiatric/Behavioral:  Negative for dysphoric mood.     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   Spinal stenosis 08/17/2012   Hyperlipidemia associated with type 2 diabetes mellitus ([place]) 08/17/1998   Essential hypertension 08/17/1988   Insomnia 08/17/1986    Allergies  Allergen Reactions   Diltiazem Hives    Past Surgical History:  Procedure Laterality Date   ABDOMINAL HYSTERECTOMY  1994   BARIATRIC SURGERY  2015   gastric sleeve  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   UPPER GI ENDOSCOPY  2023    Social History   Tobacco Use   Smoking status: Never   Smokeless tobacco: Never  Substance Use Topics   Alcohol use: Yes    Comment: rare   Drug use: No     Medication list has been reviewed and updated.  Current Meds  Medication Sig   atenolol (TENORMIN) 25 MG tablet Take 1 tablet (25 mg total) by mouth daily.    atorvastatin (LIPITOR) 40 MG tablet Take 1 tablet (40 mg total) by mouth daily.   buPROPion (WELLBUTRIN XL) 300 MG 24 hr tablet Take 1 tablet (300 mg total) by mouth daily.   escitalopram (LEXAPRO) 10 MG tablet TAKE 1 TABLET BY MOUTH DAILY   latanoprost (XALATAN) 0.005 % ophthalmic solution 1 drop at bedtime.   metFORMIN (GLUCOPHAGE) 500 MG tablet TAKE 1 TABLET BY MOUTH TWICE  DAILY WITH MEALS   pantoprazole (PROTONIX) 40 MG tablet Take 40 mg by mouth daily.   QUEtiapine (SEROQUEL) 25 MG tablet Take 25 mg by mouth at bedtime. As needed.   traZODone (DESYREL) 50 MG tablet Take 50 mg by mouth at bedtime.   UNABLE TO FIND Med Name: Nature's Burnett Kanaris and Fruit Supplement       08/25/2022    2:29 PM 04/27/2022    2:52 PM  GAD 7 : Generalized Anxiety Score  Nervous, Anxious, on Edge 1 2  Control/stop worrying 2 2  Worry too much - different things 2 1  Trouble relaxing 0 3  Restless 0 1  Easily annoyed or irritable 0 1  Afraid - awful might happen 0 0  Total GAD 7 Score 5 10  Anxiety Difficulty Not difficult at all Somewhat difficult       08/25/2022    2:29 PM 04/27/2022    2:51 PM  Depression screen PHQ 2/9  Decreased Interest 1 0  Down, Depressed, Hopeless 1 1  PHQ - 2 Score 2 1  Altered sleeping 3 3  Tired, decreased energy 2 3  Change in appetite 2 1  Feeling bad or failure about yourself  2 0  Trouble concentrating 1 1  Moving slowly or fidgety/restless 1 0  Suicidal thoughts 0 0  PHQ-9 Score 13 9  Difficult doing work/chores Somewhat difficult Somewhat difficult    BP Readings from Last 3 Encounters:  08/25/22 122/70  07/29/22 121/76  04/27/22 132/78    Physical Exam Vitals and nursing note reviewed.  Constitutional:      General: She is not in acute distress.    Appearance: She is well-developed.  HENT:     Head: Normocephalic and atraumatic.  Neck:     Vascular: No carotid bruit.  Cardiovascular:     Rate and Rhythm: Normal rate and regular rhythm.   Pulmonary:     Effort: Pulmonary effort is normal. No respiratory distress.     Breath sounds: No wheezing or rhonchi.  Musculoskeletal:     Cervical back: Normal range of motion.     Lumbar back: No spasms or bony tenderness. Decreased range of motion. Negative right straight leg raise test and negative left straight leg raise test.  Lymphadenopathy:     Cervical: No cervical adenopathy.  Skin:    General: Skin is warm and dry.     Findings: No rash.  Neurological:     Mental Status: She is alert and oriented to person, place, and time.  Psychiatric:  Mood and Affect: Mood normal.        Behavior: Behavior normal.     Wt Readings from Last 3 Encounters:  08/25/22 173 lb 6.4 oz (78.7 kg)  07/29/22 169 lb (76.7 kg)  04/27/22 167 lb 3.2 oz (75.8 kg)    BP 122/70   Pulse 88   Ht 5\' 6"  (1.676 m)   Wt 173 lb 6.4 oz (78.7 kg)   BMI 27.99 kg/m   Assessment and Plan: Problem List Items Addressed This Visit       Cardiovascular and Mediastinum   Essential hypertension (Chronic)    Clinically stable exam with well controlled BP. Tolerating medications without side effects at this time. Pt to continue current regimen and low sodium diet; benefits of regular exercise as able discussed.         Endocrine   Type II diabetes mellitus with complication (HCC) - Primary (Chronic)    Clinically stable by exam and report without s/s of hypoglycemia. DM complicated by hypertension and dyslipidemia. Tolerating metformin well without side effects or other concerns. Last A1C 6.3.       Relevant Orders   Microalbumin / creatinine urine ratio   Hemoglobin A1c   Other Visit Diagnoses     Need for hepatitis C screening test       Relevant Orders   Hepatitis C antibody   Encounter for screening mammogram for breast cancer       Relevant Orders   MM 3D SCREEN BREAST BILATERAL   Chronic bilateral low back pain without sciatica       Imaging shows DDD and  spondylolisthesis continue topical patches Consult Dr.        Partially dictated using Dragon software. Any errors are unintentional.  Ashley Royalty, MD [facility]  08/25/2022

## 2022-08-25 NOTE — Assessment & Plan Note (Signed)
Clinically stable by exam and report without s/s of hypoglycemia. DM complicated by hypertension and dyslipidemia. Tolerating metformin well without side effects or other concerns. Last A1C 6.3.

## 2022-08-26 ENCOUNTER — Encounter: Payer: Self-pay | Admitting: Gastroenterology

## 2022-08-26 LAB — MICROALBUMIN / CREATININE URINE RATIO
Creatinine, Urine: 44.4 mg/dL
Microalb/Creat Ratio: 12 mg/g creat (ref 0–29)
Microalbumin, Urine: 5.2 ug/mL

## 2022-08-26 LAB — HEPATITIS C ANTIBODY: Hep C Virus Ab: NONREACTIVE

## 2022-08-26 LAB — HEMOGLOBIN A1C
Est. average glucose Bld gHb Est-mCnc: 137 mg/dL
Hgb A1c MFr Bld: 6.4 % — ABNORMAL HIGH (ref 4.8–5.6)

## 2022-08-27 ENCOUNTER — Ambulatory Visit: Payer: Medicare Other | Admitting: Internal Medicine

## 2022-08-31 ENCOUNTER — Ambulatory Visit (INDEPENDENT_AMBULATORY_CARE_PROVIDER_SITE_OTHER): Payer: Medicare Other | Admitting: Family Medicine

## 2022-08-31 ENCOUNTER — Encounter: Payer: Self-pay | Admitting: Family Medicine

## 2022-08-31 VITALS — BP 120/70 | HR 88 | Ht 66.0 in | Wt 165.0 lb

## 2022-08-31 DIAGNOSIS — M4727 Other spondylosis with radiculopathy, lumbosacral region: Secondary | ICD-10-CM | POA: Insufficient documentation

## 2022-08-31 MED ORDER — GABAPENTIN 100 MG PO CAPS: 100.0000 mg | ORAL_CAPSULE | Freq: Every day | ORAL | 1 refills | Status: DC

## 2022-08-31 MED ORDER — CELECOXIB 100 MG PO CAPS: 100.0000 mg | ORAL_CAPSULE | Freq: Every day | ORAL | 0 refills | Status: DC

## 2022-08-31 MED ORDER — BACLOFEN 5 MG PO TABS: 1.0000 | ORAL_TABLET | Freq: Every evening | ORAL | 0 refills | Status: DC | PRN

## 2022-08-31 NOTE — Patient Instructions (Signed)
-  Start Celebrex daily with food - Dose nightly gabapentin 100 mg - Can increase gabapentin by 1 capsule (100 mg) every 2-3 nights to adequately control symptoms, do not exceed 3 capsules (3 mg) nightly - Can dose baclofen (muscle relaxer) nightly on as-needed basis for muscle tightness pain - Perform activity as tolerated using symptoms as a guide - Start formal physical therapy, referral coordinator will contact you for scheduling - Return for follow-up in 6 weeks

## 2022-08-31 NOTE — Assessment & Plan Note (Signed)
Patient presents with atraumatic low back pain, worst at night with radiating symptoms into left greater than right leg involving burning, painful ADLs, did try OTC medications and lidocaine patches with limited response.  Denies any saddle involvement, no bowel/bladder dysfunction reported.  Examination with painful ambulation noted, tenderness at the paraspinal lumbosacral left greater than right regions without spinous process tenderness, positive straight leg raise bilaterally, positive Faber localizing to the left lower back, negative FADIR bilaterally, equivocal iliopsoas testing.  Given patient's x-ray, clinical findings, and history, concern for lumbosacral spondylosis related radiculopathy in the setting of anterolisthesis.  Plan for formal PT, medication regimen, close follow-up with low threshold for advanced imaging and referral to spine group.  She is advised to contact us over the interim for further medication management if needed.

## 2022-08-31 NOTE — Progress Notes (Signed)
     Primary Care / Sports Medicine Office Visit  Patient Information:  Patient ID: Sheri Green, female DOB: 1945/09/27 Age: 77 y.o. MRN: [id number]   Sheri Green is a pleasant 77 y.o. female presenting with the following:  Chief Complaint  Patient presents with   Back Pain    Since April 2023,     Vitals:   08/31/22 1611  BP: 120/70  Pulse: 88  SpO2: 98%   Vitals:   08/31/22 1611  Weight: 165 lb (74.8 kg)  Height: 5\' 6"  (1.676 m)   Body mass index is 26.63 kg/m.  No results found.   Independent interpretation of notes and tests performed by another provider:   Independent interpretation of recent lumbar spine x-rays reveals significant multilevel degenerative changes with intervertebral narrowing, anterior endplate osteophytes, foraminal narrowing primarily at L4-5 and maximally at L5-S1, there is grade 1 anterolisthesis of L4 on L5, no acute osseous processes visualized.  Procedures performed:   None  Pertinent History, Exam, Impression, and Recommendations:   Sheri Green was seen today for back pain.  Multilevel lumbosacral spondylosis with radiculopathy Overview: Date of onset 11/2021 in the of increased activity  Assessment & Plan: Patient presents with atraumatic low back pain, worst at night with radiating symptoms into left greater than right leg involving burning, painful ADLs, did try OTC medications and lidocaine patches with limited response.  Denies any saddle involvement, no bowel/bladder dysfunction reported.  Examination with painful ambulation noted, tenderness at the paraspinal lumbosacral left greater than right regions without spinous process tenderness, positive straight leg raise bilaterally, positive Faber localizing to the left lower back, negative FADIR bilaterally, equivocal iliopsoas testing.  Given patient's x-ray, clinical findings, and history, concern for lumbosacral spondylosis related radiculopathy in the setting of anterolisthesis.   Plan for formal PT, medication regimen, close follow-up with low threshold for advanced imaging and referral to spine group.  She is advised to contact us over the interim for further medication management if needed.  Orders: -     Celecoxib; Take 1 capsule (100 mg total) by mouth daily.  Dispense: 45 capsule; Refill: 0 -     Gabapentin; Take 1 capsule (100 mg total) by mouth at bedtime.  Dispense: 45 capsule; Refill: 1 -     Baclofen; Take 1 tablet by mouth at bedtime as needed (muscle spasm).  Dispense: 45 tablet; Refill: 0     Orders & Medications Meds ordered this encounter  Medications   celecoxib (CELEBREX) 100 MG capsule    Sig: Take 1 capsule (100 mg total) by mouth daily.    Dispense:  45 capsule    Refill:  0   gabapentin (NEURONTIN) 100 MG capsule    Sig: Take 1 capsule (100 mg total) by mouth at bedtime.    Dispense:  45 capsule    Refill:  1   Baclofen 5 MG TABS    Sig: Take 1 tablet by mouth at bedtime as needed (muscle spasm).    Dispense:  45 tablet    Refill:  0   No orders of the defined types were placed in this encounter.    Return in about 6 weeks (around 10/12/2022).     [name], MD, [facility]   Primary Care Sports Medicine Primary Care and Sports Medicine at [facility]

## 2022-09-03 ENCOUNTER — Other Ambulatory Visit: Payer: Self-pay | Admitting: Internal Medicine

## 2022-09-03 NOTE — Telephone Encounter (Signed)
Requested medication (s) are due for refill today: yes  Requested medication (s) are on the active medication list: yes  Last refill:  07/01/22 #90 0 refills  Future visit scheduled: yes in 1 month  Notes to clinic:    To pharmacy: Please send a replace/new response with 90-Day Supply if appropriate to maximize member benefit. Requesting 1 year supply.   Do you want to refill for 1 year?     Requested Prescriptions  Pending Prescriptions Disp Refills   buPROPion (WELLBUTRIN XL) 300 MG 24 hr tablet [Pharmacy Med Name: buPROPion HCl ER (XL) 300 MG Oral Tablet Extended Release 24 Hour] 90 tablet 3    Sig: TAKE 1 TABLET BY MOUTH DAILY     Psychiatry: Antidepressants - bupropion Passed - 09/03/2022  6:00 AM      Passed - Cr in normal range and within 360 days    Creatinine, Ser  Date Value Ref Range Status  04/28/2022 0.80 0.57 - 1.00 mg/dL Final         Passed - AST in normal range and within 360 days    AST  Date Value Ref Range Status  04/28/2022 17 0 - 40 IU/L Final         Passed - ALT in normal range and within 360 days    ALT  Date Value Ref Range Status  04/28/2022 13 0 - 32 IU/L Final         Passed - Last BP in normal range    BP Readings from Last 1 Encounters:  08/31/22 120/70         Passed - Valid encounter within last 6 months    Recent Outpatient Visits           3 days ago Multilevel lumbosacral spondylosis with radiculopathy   [place] [facility] at [place], Earley Abide, MD   1 week ago Type II diabetes mellitus with complication [facility])   [place] [facility] and [facility] Medicine at [facility], Jesse Sans, MD   4 months ago Essential hypertension   [place] [facility] Medicine at [facility], Jesse Sans, MD       Future Appointments             In 1 month Army Melia, Jesse Sans, MD [facility] Primary Care and Sports Medicine at [facility], [facility]   In 3 months  Army Melia, Jesse Sans, MD [place] Primary Care and Sports Medicine at [facility], [facility]

## 2022-09-07 ENCOUNTER — Ambulatory Visit: Payer: Medicare Other | Admitting: Anesthesiology

## 2022-09-07 ENCOUNTER — Ambulatory Visit
Admission: RE | Admit: 2022-09-07 | Discharge: 2022-09-07 | Disposition: A | Discharge status: Home / Self Care | Payer: Medicare Other | Attending: Gastroenterology | Admitting: Gastroenterology

## 2022-09-07 ENCOUNTER — Encounter: Payer: Self-pay | Admitting: Gastroenterology

## 2022-09-07 ENCOUNTER — Other Ambulatory Visit: Payer: Self-pay

## 2022-09-07 ENCOUNTER — Encounter
Admission: RE | Disposition: A | Discharge status: Home / Self Care | Payer: Self-pay | Source: Home / Self Care | Attending: Gastroenterology

## 2022-09-07 DIAGNOSIS — E119 Type 2 diabetes mellitus without complications: Secondary | ICD-10-CM | POA: Insufficient documentation

## 2022-09-07 DIAGNOSIS — M549 Dorsalgia, unspecified: Secondary | ICD-10-CM | POA: Diagnosis not present

## 2022-09-07 DIAGNOSIS — K219 Gastro-esophageal reflux disease without esophagitis: Secondary | ICD-10-CM | POA: Diagnosis not present

## 2022-09-07 DIAGNOSIS — K2270 Barrett's esophagus without dysplasia: Secondary | ICD-10-CM | POA: Insufficient documentation

## 2022-09-07 DIAGNOSIS — G8929 Other chronic pain: Secondary | ICD-10-CM | POA: Insufficient documentation

## 2022-09-07 DIAGNOSIS — Z7984 Long term (current) use of oral hypoglycemic drugs: Secondary | ICD-10-CM | POA: Insufficient documentation

## 2022-09-07 DIAGNOSIS — Z79899 Other long term (current) drug therapy: Secondary | ICD-10-CM | POA: Diagnosis not present

## 2022-09-07 DIAGNOSIS — I10 Essential (primary) hypertension: Secondary | ICD-10-CM | POA: Insufficient documentation

## 2022-09-07 DIAGNOSIS — Z9884 Bariatric surgery status: Secondary | ICD-10-CM | POA: Diagnosis not present

## 2022-09-07 DIAGNOSIS — M1990 Unspecified osteoarthritis, unspecified site: Secondary | ICD-10-CM | POA: Insufficient documentation

## 2022-09-07 DIAGNOSIS — K449 Diaphragmatic hernia without obstruction or gangrene: Secondary | ICD-10-CM | POA: Insufficient documentation

## 2022-09-07 HISTORY — PX: ESOPHAGOGASTRODUODENOSCOPY (EGD) WITH PROPOFOL: SHX5813

## 2022-09-07 LAB — GLUCOSE, CAPILLARY: Glucose-Capillary: 112 mg/dL — ABNORMAL HIGH (ref 70–99)

## 2022-09-07 SURGERY — ESOPHAGOGASTRODUODENOSCOPY (EGD) WITH PROPOFOL
Anesthesia: General

## 2022-09-07 MED ORDER — PROPOFOL 10 MG/ML IV BOLUS
INTRAVENOUS | Status: DC | PRN
  Administered 2022-09-07: 40 mg via INTRAVENOUS
  Administered 2022-09-07: 70 mg via INTRAVENOUS

## 2022-09-07 MED ORDER — SODIUM CHLORIDE 0.9 % IV SOLN: INTRAVENOUS | Status: DC

## 2022-09-07 MED ORDER — STERILE WATER FOR IRRIGATION IR SOLN
Status: DC | PRN
  Administered 2022-09-07: 1

## 2022-09-07 MED ORDER — LACTATED RINGERS IV SOLN: INTRAVENOUS | Status: DC

## 2022-09-07 MED ORDER — LIDOCAINE HCL (CARDIAC) PF 100 MG/5ML IV SOSY
PREFILLED_SYRINGE | INTRAVENOUS | Status: DC | PRN
  Administered 2022-09-07: 70 mg via INTRAVENOUS

## 2022-09-07 SURGICAL SUPPLY — 32 items
BALLN DILATOR 12-15 8 (BALLOONS)
BALLN DILATOR 15-18 8 (BALLOONS)
BALLN DILATOR CRE 0-12 8 (BALLOONS)
BALLN DILATOR ESOPH 8 10 CRE (MISCELLANEOUS) IMPLANT
BALLOON DILATOR 12-15 8 (BALLOONS) IMPLANT
BALLOON DILATOR 15-18 8 (BALLOONS) IMPLANT
BALLOON DILATOR CRE 0-12 8 (BALLOONS) IMPLANT
BLOCK BITE 60FR ADLT L/F GRN (MISCELLANEOUS) ×2 IMPLANT
CLIP HMST 235XBRD CATH ROT (MISCELLANEOUS) IMPLANT
CLIP RESOLUTION 360 11X235 (MISCELLANEOUS)
ELECT REM PT RETURN 9FT ADLT (ELECTROSURGICAL)
ELECTRODE REM PT RTRN 9FT ADLT (ELECTROSURGICAL) IMPLANT
FCP ESCP3.2XJMB 240X2.8X (MISCELLANEOUS)
FORCEPS BIOP RAD 4 LRG CAP 4 (CUTTING FORCEPS) IMPLANT
FORCEPS BIOP RJ4 240 W/NDL (MISCELLANEOUS)
FORCEPS ESCP3.2XJMB 240X2.8X (MISCELLANEOUS) IMPLANT
GOWN CVR UNV OPN BCK APRN NK (MISCELLANEOUS) ×4 IMPLANT
GOWN ISOL THUMB LOOP REG UNIV (MISCELLANEOUS) ×2
INJECTOR VARIJECT VIN23 (MISCELLANEOUS) IMPLANT
KIT DEFENDO VALVE AND CONN (KITS) IMPLANT
KIT PRC NS LF DISP ENDO (KITS) ×2 IMPLANT
KIT PROCEDURE OLYMPUS (KITS) ×1
MANIFOLD NEPTUNE II (INSTRUMENTS) ×2 IMPLANT
MARKER SPOT ENDO TATTOO 5ML (MISCELLANEOUS) IMPLANT
RETRIEVER NET PLAT FOOD (MISCELLANEOUS) IMPLANT
SNARE SHORT THROW 13M SML OVAL (MISCELLANEOUS) IMPLANT
SNARE SHORT THROW 30M LRG OVAL (MISCELLANEOUS) IMPLANT
SYR INFLATION 60ML (SYRINGE) IMPLANT
TRAP ETRAP POLY (MISCELLANEOUS) IMPLANT
VARIJECT INJECTOR VIN23 (MISCELLANEOUS)
WATER STERILE IRR 250ML POUR (IV SOLUTION) ×2 IMPLANT
WIRE CRE 18-20MM 8CM F G (MISCELLANEOUS) IMPLANT

## 2022-09-07 NOTE — H&P (Signed)
[name], MD [facility] [address], [place], [place] [address] [phone] Fax : [phone]  Primary Care Physician:  Glean Hess, MD Primary Gastroenterologist:  Dr. Allen Norris  Pre-Procedure History & Physical: HPI:  Sheri Green is a 77 y.o. female is here for an endoscopy.   Past Medical History:  Diagnosis Date   Barrett's esophagus determined by endoscopy    Diabetes mellitus without complication ([place])    Glaucoma    Hypertension    Sciatica of left side    Urinary incontinence     Past Surgical History:  Procedure Laterality Date   ABDOMINAL HYSTERECTOMY  1994   BARIATRIC SURGERY  2015   gastric sleeve   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   UPPER GI ENDOSCOPY  2023    Prior to Admission medications   Medication Sig Start Date End Date Taking? Authorizing Provider  atenolol (TENORMIN) 25 MG tablet Take 1 tablet (25 mg total) by mouth daily. 06/17/22  Yes Glean Hess, MD  atorvastatin (LIPITOR) 40 MG tablet Take 1 tablet (40 mg total) by mouth daily. 06/17/22  Yes Glean Hess, MD  Baclofen 5 MG TABS Take 1 tablet by mouth at bedtime as needed (muscle spasm). 08/31/22  Yes [name], MD  buPROPion (WELLBUTRIN XL) 300 MG 24 hr tablet Take 1 tablet (300 mg total) by mouth daily. 07/01/22  Yes Glean Hess, MD  celecoxib (CELEBREX) 100 MG capsule Take 1 capsule (100 mg total) by mouth daily. 08/31/22  Yes [name], MD  escitalopram (LEXAPRO) 10 MG tablet TAKE 1 TABLET BY MOUTH DAILY 07/28/22  Yes Glean Hess, MD  gabapentin (NEURONTIN) 100 MG capsule Take 1 capsule (100 mg total) by mouth at bedtime. 08/31/22  Yes [name], MD  latanoprost (XALATAN) 0.005 % ophthalmic solution 1 drop at bedtime.   Yes [provider]  metFORMIN (GLUCOPHAGE) 500 MG tablet TAKE 1 TABLET BY MOUTH TWICE  DAILY WITH MEALS 07/28/22  Yes Glean Hess, MD  pantoprazole (PROTONIX) 40 MG tablet Take 40 mg by mouth  daily.   Yes [provider]  QUEtiapine (SEROQUEL) 25 MG tablet Take 25 mg by mouth at bedtime. As needed.   Yes [provider]  traZODone (DESYREL) 50 MG tablet Take 50 mg by mouth at bedtime.   Yes [provider]  UNABLE TO FIND Med Name: Nature's Mechele Dawley and Fruit Supplement   Yes [provider]    Allergies as of 07/29/2022 - Review Complete 07/29/2022  Allergen Reaction Noted   Diltiazem Hives 04/27/2022    Family History  Problem Relation Age of Onset   Bladder Cancer Mother    Heart disease Father    Glaucoma Father    Hypertension Father    Heart attack Father    Hyperlipidemia Sister    Hypertension Brother     Social History   Socioeconomic History   Marital status: Married    Spouse name: Not on file   Number of children: Not on file   Years of education: Not on file   Highest education level: Not on file  Occupational History   Not on file  Tobacco Use   Smoking status: Never   Smokeless tobacco: Never  Vaping Use   Vaping Use: Not on file  Substance and Sexual Activity   Alcohol use: Yes    Comment: rare   Drug use: No   Sexual activity: Not Currently  Birth control/protection: None  Other Topics Concern   Not on file  Social History Narrative   Not on file   Social Determinants of Health   Financial Resource Strain: Not on file  Food Insecurity: Not on file  Transportation Needs: Not on file  Physical Activity: Not on file  Stress: Not on file  Social Connections: Not on file  Intimate Partner Violence: Not on file    Review of Systems: See HPI, otherwise negative ROS  Physical Exam: BP 103/61   Temp (!) 97.1 F (36.2 C) (Tympanic)   Ht 5' 5.98" (1.676 m)   Wt 77.4 kg   SpO2 100%   BMI 27.57 kg/m  General:   Alert,  pleasant and cooperative in NAD Head:  Normocephalic and atraumatic. Neck:  Supple; no masses or thyromegaly. Lungs:  Clear throughout to auscultation.    Heart:   Regular rate and rhythm. Abdomen:  Soft, nontender and nondistended. Normal bowel sounds, without guarding, and without rebound.   Neurologic:  Alert and  oriented x4;  grossly normal neurologically.  Impression/Plan: EKTA DANCER is here for an endoscopy to be performed for barrett's esophagus  Risks, benefits, limitations, and alternatives regarding  endoscopy have been reviewed with the patient.  Questions have been answered.  All parties agreeable.   [name], MD  09/07/2022, 10:41 AM

## 2022-09-07 NOTE — Transfer of Care (Signed)
Immediate Anesthesia Transfer of Care Note  Patient: Sheri Green  Procedure(s) Performed: ESOPHAGOGASTRODUODENOSCOPY (EGD) WITH PROPOFOL  Patient Location: PACU  Anesthesia Type: General  Level of Consciousness: awake, alert  and patient cooperative  Airway and Oxygen Therapy: Patient Spontanous Breathing and Patient connected to supplemental oxygen  Post-op Assessment: Post-op Vital signs reviewed, Patient's Cardiovascular Status Stable, Respiratory Function Stable, Patent Airway and No signs of Nausea or vomiting  Post-op Vital Signs: Reviewed and stable  Complications: No notable events documented.

## 2022-09-07 NOTE — Op Note (Signed)
[facility] Patient Name: Sheri Green Procedure Date: 09/07/2022 10:34 AM MRN: [id number] Account #: [id number] Date of Birth: 31-Dec-1945 Admit Type: Outpatient Age: 77 Room: [facility] OR ROOM 01 Gender: Female Note Status: Finalized Instrument Name: [id number] Procedure:             Upper GI endoscopy Indications:           Barrett's esophagus Providers:             [name] MD, MD Referring MD:          Halina Maidens, MD (Referring MD) Medicines:             Propofol per Anesthesia Complications:         No immediate complications. Procedure:             Pre-Anesthesia Assessment:                        - Prior to the procedure, a History and Physical was                         performed, and patient medications and allergies were                         reviewed. The patient's tolerance of previous                         anesthesia was also reviewed. The risks and benefits                         of the procedure and the sedation options and risks                         were discussed with the patient. All questions were                         answered, and informed consent was obtained. Prior                         Anticoagulants: The patient has taken no anticoagulant                         or antiplatelet agents. ASA Grade Assessment: II - A                         patient with mild systemic disease. After reviewing                         the risks and benefits, the patient was deemed in                         satisfactory condition to undergo the procedure.                        After obtaining informed consent, the endoscope was                         passed under direct vision. Throughout the procedure,  the patient's blood pressure, pulse, and oxygen                         saturations were monitored continuously. The Endoscope                         was introduced through the mouth, and advanced to the                          second part of duodenum. The upper GI endoscopy was                         accomplished without difficulty. The patient tolerated                         the procedure well. Findings:      A small hiatal hernia was present.      The Z-line was irregular and was found at the gastroesophageal junction.      A benign-appearing, intrinsic stenosis was found in the gastric body.       This was traversed.      The examined duodenum was normal. Impression:            - Small hiatal hernia.                        - Z-line irregular, at the gastroesophageal junction.                        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                        - Normal examined duodenum.                        - No specimens collected. Recommendation:        - Discharge patient to home.                        - Resume previous diet.                        - Continue present medications.                        - Do an upper GI series. Procedure Code(s):     --- Professional ---                        [id number], Esophagogastroduodenoscopy, flexible,                         transoral; diagnostic, including collection of                         specimen(s) by brushing or washing, when performed                         (separate procedure) Diagnosis Code(s):     --- Professional ---  K22.70, Barrett's esophagus without dysplasia CPT copyright 2022 American Medical Association. All rights reserved. The codes documented in this report are preliminary and upon coder review may  be revised to meet current compliance requirements. [name] MD, MD 09/07/2022 11:00:21 AM This report has been signed electronically. Number of Addenda: 0 Note Initiated On: 09/07/2022 10:34 AM Total Procedure Duration: 0 hours 2 minutes 55 seconds  Estimated Blood Loss:  Estimated blood loss: none.      [facility]

## 2022-09-07 NOTE — Anesthesia Postprocedure Evaluation (Signed)
Anesthesia Post Note  Patient: Sheri Green  Procedure(s) Performed: ESOPHAGOGASTRODUODENOSCOPY (EGD) WITH PROPOFOL  Patient location during evaluation: PACU Anesthesia Type: General Level of consciousness: awake and alert, oriented and patient cooperative Pain management: pain level controlled Vital Signs Assessment: post-procedure vital signs reviewed and stable Respiratory status: spontaneous breathing, nonlabored ventilation and respiratory function stable Cardiovascular status: blood pressure returned to baseline and stable Postop Assessment: adequate PO intake Anesthetic complications: no   No notable events documented.   Last Vitals:  Vitals:   09/07/22 1102 09/07/22 1115  BP: 133/70 128/79  Pulse: 81 80  Resp:  (!) 21  Temp: (!) 36.4 C (!) 36.4 C  SpO2: 100% 97%    Last Pain:  Vitals:   09/07/22 1115  TempSrc:   PainSc: 0-No pain                 Darrin Nipper

## 2022-09-07 NOTE — Anesthesia Preprocedure Evaluation (Addendum)
Anesthesia Evaluation  Patient identified by MRN, date of birth, ID band Patient awake    Reviewed: Allergy & Precautions, NPO status , Patient's Chart, lab work & pertinent test results  History of Anesthesia Complications Negative for: history of anesthetic complications  Airway Mallampati: III   Neck ROM: Full    Dental  (+) Caps Crowns :   Pulmonary neg pulmonary ROS   Pulmonary exam normal breath sounds clear to auscultation       Cardiovascular hypertension, Normal cardiovascular exam Rhythm:Regular Rate:Normal     Neuro/Psych Chronic back pain    GI/Hepatic ,GERD (Barrett esophagus)  ,,S/p bariatric surgery 2015   Endo/Other  diabetes, Type 2    Renal/GU negative Renal ROS     Musculoskeletal  (+) Arthritis ,    Abdominal   Peds  Hematology negative hematology ROS (+)   Anesthesia Other Findings   Reproductive/Obstetrics                             Anesthesia Physical Anesthesia Plan  ASA: 2  Anesthesia Plan: General   Post-op Pain Management:    Induction: Intravenous  PONV Risk Score and Plan: 3 and Propofol infusion, TIVA and Treatment may vary due to age or medical condition  Airway Management Planned: Natural Airway  Additional Equipment:   Intra-op Plan:   Post-operative Plan:   Informed Consent: I have reviewed the patients History and Physical, chart, labs and discussed the procedure including the risks, benefits and alternatives for the proposed anesthesia with the patient or authorized representative who has indicated his/her understanding and acceptance.       Plan Discussed with: CRNA  Anesthesia Plan Comments: (LMA/GETA backup discussed.  Patient consented for risks of anesthesia including but not limited to:  - adverse reactions to medications - damage to eyes, teeth, lips or other oral mucosa - nerve damage due to positioning  - sore throat  or hoarseness - damage to heart, brain, nerves, lungs, other parts of body or loss of life  Informed patient about role of CRNA in peri- and intra-operative care.  Patient voiced understanding.)        Anesthesia Quick Evaluation

## 2022-09-09 ENCOUNTER — Encounter: Payer: Self-pay | Admitting: Gastroenterology

## 2022-09-23 ENCOUNTER — Telehealth: Payer: Medicare Other | Admitting: Gastroenterology

## 2022-09-23 ENCOUNTER — Other Ambulatory Visit: Payer: Self-pay

## 2022-09-23 ENCOUNTER — Telehealth: Payer: Self-pay | Admitting: Internal Medicine

## 2022-09-23 ENCOUNTER — Other Ambulatory Visit: Payer: Self-pay | Admitting: Family Medicine

## 2022-09-23 DIAGNOSIS — M4727 Other spondylosis with radiculopathy, lumbosacral region: Secondary | ICD-10-CM

## 2022-09-23 MED ORDER — LIDOCAINE 5 % EX PTCH: 3.0000 | MEDICATED_PATCH | CUTANEOUS | Status: DC

## 2022-09-23 MED ORDER — LIDOCAINE 5 % EX PTCH: 1.0000 | MEDICATED_PATCH | Freq: Two times a day (BID) | CUTANEOUS | 2 refills | Status: DC

## 2022-09-23 NOTE — Telephone Encounter (Signed)
Copied from [place] [id number]. Topic: General - Other >> Sep 23, 2022  1:04 PM Dominique A wrote: Reason for CRM: Pt states that she is having issues with not sleeping at all. She is wanting to know if Dr. Zigmund Daniel can add this to her treatment : lidocaine pads Per pt when she takes the gabapentin and the muscle relaxer before she goes to bed as prescribed it is not giving her any relief at night time.  Pt would like for her PCP to reach out to her

## 2022-09-23 NOTE — Telephone Encounter (Signed)
Please review.  KP

## 2022-09-23 NOTE — Telephone Encounter (Signed)
I can't send in the patches the system wont let me send it in.  KP

## 2022-10-12 ENCOUNTER — Ambulatory Visit: Payer: Medicare Other | Admitting: Family Medicine

## 2022-10-15 ENCOUNTER — Other Ambulatory Visit: Payer: Self-pay | Admitting: Family Medicine

## 2022-10-15 DIAGNOSIS — M4727 Other spondylosis with radiculopathy, lumbosacral region: Secondary | ICD-10-CM

## 2022-10-15 NOTE — Telephone Encounter (Signed)
Unable to refill per protocol, Rx request is too soon. Last refill 08/31/22 for 45 days and 1 refill.  Requested Prescriptions  Pending Prescriptions Disp Refills   gabapentin (NEURONTIN) 100 MG capsule [Pharmacy Med Name: GABAPENTIN '100MG'$  CAPSULES] 45 capsule 1    Sig: TAKE 1 CAPSULE(100 MG) BY MOUTH AT BEDTIME     Neurology: Anticonvulsants - gabapentin Passed - 10/15/2022 10:37 AM      Passed - Cr in normal range and within 360 days    Creatinine, Ser  Date Value Ref Range Status  04/28/2022 0.80 0.57 - 1.00 mg/dL Final         Passed - Completed PHQ-2 or PHQ-9 in the last 360 days      Passed - Valid encounter within last 12 months    Recent Outpatient Visits           1 month ago Multilevel lumbosacral spondylosis with radiculopathy   [place] [facility] at [place], Earley Abide, MD   1 month ago Type II diabetes mellitus with complication [facility])   [place] at [facility], Jesse Sans, MD   5 months ago Essential hypertension   [place] at [facility], Jesse Sans, MD       Future Appointments             In 4 days Zigmund Daniel, Earley Abide, MD [place] at [facility], [facility]   In 2 months Army Melia, Jesse Sans, MD [place] at [facility], [facility]

## 2022-10-19 ENCOUNTER — Other Ambulatory Visit: Payer: Self-pay

## 2022-10-19 ENCOUNTER — Ambulatory Visit: Payer: Medicare Other | Admitting: Family Medicine

## 2022-10-19 DIAGNOSIS — M4727 Other spondylosis with radiculopathy, lumbosacral region: Secondary | ICD-10-CM

## 2022-10-19 MED ORDER — GABAPENTIN 100 MG PO CAPS: 100.0000 mg | ORAL_CAPSULE | Freq: Every day | ORAL | 0 refills | Status: DC

## 2022-10-20 ENCOUNTER — Other Ambulatory Visit: Payer: Self-pay | Admitting: Internal Medicine

## 2022-10-20 NOTE — Telephone Encounter (Signed)
Requested Prescriptions  Pending Prescriptions Disp Refills   buPROPion (WELLBUTRIN XL) 300 MG 24 hr tablet [Pharmacy Med Name: buPROPion HCl ER (XL) 300 MG Oral Tablet Extended Release 24 Hour] 90 tablet 1    Sig: TAKE 1 TABLET BY MOUTH DAILY     Psychiatry: Antidepressants - bupropion Passed - 10/20/2022  2:22 PM      Passed - Cr in normal range and within 360 days    Creatinine, Ser  Date Value Ref Range Status  04/28/2022 0.80 0.57 - 1.00 mg/dL Final         Passed - AST in normal range and within 360 days    AST  Date Value Ref Range Status  04/28/2022 17 0 - 40 IU/L Final         Passed - ALT in normal range and within 360 days    ALT  Date Value Ref Range Status  04/28/2022 13 0 - 32 IU/L Final         Passed - Last BP in normal range    BP Readings from Last 1 Encounters:  09/07/22 128/79         Passed - Valid encounter within last 6 months    Recent Outpatient Visits           1 month ago Multilevel lumbosacral spondylosis with radiculopathy   [place] [facility] at [place], Earley Abide, MD   1 month ago Type II diabetes mellitus with complication [facility])   [place] at [facility], Jesse Sans, MD   5 months ago Essential hypertension   [place] at [facility], Jesse Sans, MD       Future Appointments             In 1 week Zigmund Daniel, Earley Abide, MD [place] at [facility], [facility]   In 2 months Army Melia, Jesse Sans, MD [place] at [facility], [facility]

## 2022-10-21 ENCOUNTER — Encounter: Payer: Self-pay | Admitting: Internal Medicine

## 2022-10-27 LAB — HM DIABETES EYE EXAM

## 2022-10-28 ENCOUNTER — Encounter: Payer: Self-pay | Admitting: Internal Medicine

## 2022-10-29 ENCOUNTER — Encounter: Payer: Self-pay | Admitting: Family Medicine

## 2022-10-29 ENCOUNTER — Ambulatory Visit (INDEPENDENT_AMBULATORY_CARE_PROVIDER_SITE_OTHER): Payer: Medicare Other | Admitting: Family Medicine

## 2022-10-29 DIAGNOSIS — M4727 Other spondylosis with radiculopathy, lumbosacral region: Secondary | ICD-10-CM

## 2022-10-29 MED ORDER — FAMOTIDINE 40 MG PO TABS: 40.0000 mg | ORAL_TABLET | Freq: Every day | ORAL | 2 refills | Status: DC | PRN

## 2022-10-29 MED ORDER — GABAPENTIN 100 MG PO CAPS: 100.0000 mg | ORAL_CAPSULE | Freq: Every evening | ORAL | 2 refills | Status: DC | PRN

## 2022-10-29 MED ORDER — MELOXICAM 7.5 MG PO TABS: 7.5000 mg | ORAL_TABLET | Freq: Every day | ORAL | 2 refills | Status: AC | PRN

## 2022-10-29 NOTE — Patient Instructions (Addendum)
-   Take gabapentin nightly as-needed - Take meloxicam daily as-needed, take with food - Take Pepcid (famotidine) daily if you are dosing meloxicam - Coordinator will contact you for MRI and spine group follow-up - Contact our office if outer hip symptoms persist

## 2022-10-29 NOTE — Assessment & Plan Note (Signed)
Chronic, interval improved motor focal symptoms to the left lower lumbar spine, lateral hip, improved and nearly resolved symptoms into the left leg.  Patient was able to dose medications but was unable to start physical therapy.  Examination with equivocal/positive left modified straight leg raise, tenderness at the left greater trochanter, left SI joint, positive Kemps test.  We discussed residual findings, treatment strategies, will refer to pain and spine group for interventional spine considerations, order MRI lumbar spine without contrast for possible procedural planning, transition gabapentin to nightly as needed dosing given improvement, transition from Celebrex due to GI effects to low-dose meloxicam, adjunct famotidine, these are to be used as needed, and she can follow-up if lateral hip symptoms need to be addressed.  Otherwise will follow peripherally for these issues.

## 2022-10-29 NOTE — Progress Notes (Signed)
     Primary Care / Sports Medicine Office Visit  Patient Information:  Patient ID: Sheri Green, female DOB: [birth year] Age: 77 y.o. MRN: [id number]   Sheri Green is a pleasant 77 y.o. female presenting with the following:  Chief Complaint  Patient presents with   Multilevel lumbosacral spondylosis with radiculopathy    Leg pain is not there just lower back.     Vitals:   10/29/22 1502  BP: 120/80  Pulse: 82  SpO2: 97%   Vitals:   10/29/22 1502  Weight: 170 lb (77.1 kg)  Height: 5\' 6"  (1.676 m)   Body mass index is 27.44 kg/m.  No results found.   Independent interpretation of notes and tests performed by another provider:   None  Procedures performed:   None  Pertinent History, Exam, Impression, and Recommendations:   Sheri Green was seen today for multilevel lumbosacral spondylosis with radiculopathy.  Multilevel lumbosacral spondylosis with radiculopathy Overview: Date of onset 11/2021 in the of increased activity  Assessment & Plan: Chronic, interval improved motor focal symptoms to the left lower lumbar spine, lateral hip, improved and nearly resolved symptoms into the left leg.  Patient was able to dose medications but was unable to start physical therapy.  Examination with equivocal/positive left modified straight leg raise, tenderness at the left greater trochanter, left SI joint, positive Kemps test.  We discussed residual findings, treatment strategies, will refer to pain and spine group for interventional spine considerations, order MRI lumbar spine without contrast for possible procedural planning, transition gabapentin to nightly as needed dosing given improvement, transition from Celebrex due to GI effects to low-dose meloxicam, adjunct famotidine, these are to be used as needed, and she can follow-up if lateral hip symptoms need to be addressed.  Otherwise will follow peripherally for these issues.  Orders: -     Gabapentin; Take 1 capsule (100 mg  total) by mouth at bedtime as needed.  Dispense: 30 capsule; Refill: 2  Other orders -     Meloxicam; Take 1 tablet (7.5 mg total) by mouth daily as needed for pain. Take with food.  Dispense: 30 tablet; Refill: 2 -     Famotidine; Take 1 tablet (40 mg total) by mouth daily as needed for heartburn or indigestion.  Dispense: 30 tablet; Refill: 2     Orders & Medications Meds ordered this encounter  Medications   gabapentin (NEURONTIN) 100 MG capsule    Sig: Take 1 capsule (100 mg total) by mouth at bedtime as needed.    Dispense:  30 capsule    Refill:  2   meloxicam (MOBIC) 7.5 MG tablet    Sig: Take 1 tablet (7.5 mg total) by mouth daily as needed for pain. Take with food.    Dispense:  30 tablet    Refill:  2   famotidine (PEPCID) 40 MG tablet    Sig: Take 1 tablet (40 mg total) by mouth daily as needed for heartburn or indigestion.    Dispense:  30 tablet    Refill:  2   No orders of the defined types were placed in this encounter.    Return if symptoms worsen or fail to improve.     [name], MD, [facility]   Primary Care Sports Medicine [facility] at [facility]

## 2022-10-30 ENCOUNTER — Telehealth: Payer: Self-pay | Admitting: Internal Medicine

## 2022-10-30 NOTE — Telephone Encounter (Signed)
Copied from [place] [id number]. Topic: General - Other >> Oct 30, 2022  8:22 AM Chapman Fitch wrote: Reason for CRM: Pt missed a call / please advise

## 2022-10-30 NOTE — Telephone Encounter (Signed)
Called pt told her that I did not see a note in her chart. Viewed pts chart she needed a AWV. Told pt that could be what the phone call was for to schedule an appointment. Offered to schedule an appointment pt declined.  KP

## 2022-11-07 ENCOUNTER — Other Ambulatory Visit: Payer: Medicare Other

## 2022-11-10 ENCOUNTER — Ambulatory Visit (INDEPENDENT_AMBULATORY_CARE_PROVIDER_SITE_OTHER): Payer: Medicare Other

## 2022-11-10 DIAGNOSIS — M4727 Other spondylosis with radiculopathy, lumbosacral region: Secondary | ICD-10-CM | POA: Diagnosis not present

## 2022-11-13 ENCOUNTER — Other Ambulatory Visit: Payer: Self-pay

## 2022-11-13 DIAGNOSIS — M4727 Other spondylosis with radiculopathy, lumbosacral region: Secondary | ICD-10-CM

## 2022-11-13 NOTE — Progress Notes (Signed)
PC to pt, discussed resutls, referral placed pt voiced understanding.

## 2022-11-26 ENCOUNTER — Other Ambulatory Visit: Payer: Self-pay | Admitting: Internal Medicine

## 2022-11-26 DIAGNOSIS — F5101 Primary insomnia: Secondary | ICD-10-CM

## 2022-11-26 MED ORDER — QUETIAPINE FUMARATE 25 MG PO TABS: 25.0000 mg | ORAL_TABLET | Freq: Every day | ORAL | 0 refills | Status: DC

## 2022-12-16 ENCOUNTER — Other Ambulatory Visit: Payer: Self-pay | Admitting: Internal Medicine

## 2022-12-17 ENCOUNTER — Ambulatory Visit
Admission: RE | Admit: 2022-12-17 | Discharge: 2022-12-17 | Disposition: A | Discharge status: Home / Self Care | Payer: Medicare Other | Source: Ambulatory Visit | Attending: Internal Medicine | Admitting: Internal Medicine

## 2022-12-17 DIAGNOSIS — Z1231 Encounter for screening mammogram for malignant neoplasm of breast: Secondary | ICD-10-CM | POA: Diagnosis present

## 2022-12-21 ENCOUNTER — Other Ambulatory Visit: Payer: Self-pay | Admitting: Internal Medicine

## 2022-12-21 DIAGNOSIS — I10 Essential (primary) hypertension: Secondary | ICD-10-CM

## 2022-12-22 ENCOUNTER — Ambulatory Visit (INDEPENDENT_AMBULATORY_CARE_PROVIDER_SITE_OTHER): Payer: Medicare Other | Admitting: Internal Medicine

## 2022-12-22 ENCOUNTER — Encounter: Payer: Self-pay | Admitting: Internal Medicine

## 2022-12-22 ENCOUNTER — Other Ambulatory Visit: Payer: Self-pay | Admitting: *Deleted

## 2022-12-22 ENCOUNTER — Ambulatory Visit
Admission: RE | Admit: 2022-12-22 | Discharge: 2022-12-22 | Disposition: A | Discharge status: Home / Self Care | Payer: Self-pay | Source: Ambulatory Visit | Attending: Internal Medicine | Admitting: Internal Medicine

## 2022-12-22 ENCOUNTER — Ambulatory Visit (INDEPENDENT_AMBULATORY_CARE_PROVIDER_SITE_OTHER): Payer: Medicare Other

## 2022-12-22 VITALS — BP 118/78 | HR 76 | Ht 66.0 in | Wt 177.0 lb

## 2022-12-22 VITALS — Ht 66.0 in

## 2022-12-22 DIAGNOSIS — F324 Major depressive disorder, single episode, in partial remission: Secondary | ICD-10-CM | POA: Diagnosis not present

## 2022-12-22 DIAGNOSIS — Z0000 Encounter for general adult medical examination without abnormal findings: Secondary | ICD-10-CM

## 2022-12-22 DIAGNOSIS — E785 Hyperlipidemia, unspecified: Secondary | ICD-10-CM

## 2022-12-22 DIAGNOSIS — Z7984 Long term (current) use of oral hypoglycemic drugs: Secondary | ICD-10-CM

## 2022-12-22 DIAGNOSIS — K22719 Barrett's esophagus with dysplasia, unspecified: Secondary | ICD-10-CM | POA: Diagnosis not present

## 2022-12-22 DIAGNOSIS — N3941 Urge incontinence: Secondary | ICD-10-CM

## 2022-12-22 DIAGNOSIS — Z1231 Encounter for screening mammogram for malignant neoplasm of breast: Secondary | ICD-10-CM

## 2022-12-22 DIAGNOSIS — E118 Type 2 diabetes mellitus with unspecified complications: Secondary | ICD-10-CM | POA: Diagnosis not present

## 2022-12-22 DIAGNOSIS — E1169 Type 2 diabetes mellitus with other specified complication: Secondary | ICD-10-CM

## 2022-12-22 DIAGNOSIS — M4727 Other spondylosis with radiculopathy, lumbosacral region: Secondary | ICD-10-CM

## 2022-12-22 DIAGNOSIS — I10 Essential (primary) hypertension: Secondary | ICD-10-CM

## 2022-12-22 LAB — POCT GLYCOSYLATED HEMOGLOBIN (HGB A1C): Hemoglobin A1C: 6.5 % — AB (ref 4.0–5.6)

## 2022-12-22 MED ORDER — ESCITALOPRAM OXALATE 10 MG PO TABS: 10.0000 mg | ORAL_TABLET | Freq: Every day | ORAL | 1 refills | Status: DC

## 2022-12-22 MED ORDER — ATORVASTATIN CALCIUM 40 MG PO TABS: 40.0000 mg | ORAL_TABLET | Freq: Every day | ORAL | 1 refills | Status: DC

## 2022-12-22 NOTE — Assessment & Plan Note (Signed)
Some benefit from gabapentin but having severe incontinence Referred for ESI so may be able to stop gabapentin

## 2022-12-22 NOTE — Assessment & Plan Note (Addendum)
Recent EGD  08/2022 - Small hiatal hernia. - Z- line irregular, at the gastroesophageal junction. 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 - Normal examined duodenum. - No specimens collected. Per Dr. Servando Snare, she can remain off of PPI if asymptomatic.

## 2022-12-22 NOTE — Assessment & Plan Note (Signed)
Much worse since taking gabapentin. Recommend that she stop it to determine benefit vs side effects

## 2022-12-22 NOTE — Assessment & Plan Note (Signed)
Stable exam with well controlled BP.  Currently taking atenolol. Tolerating medications without concerns or side effects. Will continue to recommend low sodium diet and current regimen.  

## 2022-12-22 NOTE — Assessment & Plan Note (Signed)
Clinically stable on current regimen with good control of symptoms, No SI or HI. On Lexapro, Bupropion, Trazodone and Seroquel long term. No change in management at this time.

## 2022-12-22 NOTE — Progress Notes (Signed)
Date:  12/22/2022   Name:  Sheri Green   DOB:  1946/05/25   MRN:  [id number]   Chief Complaint: Hypertension and Diabetes  Diabetes She presents for her follow-up diabetic visit. She has type 2 diabetes mellitus. Her disease course has been stable. Pertinent negatives for hypoglycemia include no dizziness, headaches or tremors. Pertinent negatives for diabetes include no chest pain, no fatigue, no polydipsia, no polyuria and no weakness. Pertinent negatives for diabetic complications include no CVA. Current diabetic treatment includes oral agent (monotherapy).  Hypertension This is a chronic problem. The problem is controlled. Pertinent negatives include no chest pain, headaches, palpitations or shortness of breath. Past treatments include beta blockers. The current treatment provides significant improvement. There is no history of kidney disease, CAD/MI or CVA.  Depression      (hx gastric bypass and Barrett's)  This is a chronic problem.The problem is unchanged.  Associated symptoms include no fatigue, no appetite change and no headaches.     The symptoms are aggravated by family issues.  Past treatments include SSRIs - Selective serotonin reuptake inhibitors, TCAs - Tricyclic antidepressants and other medications (lexapro, bupropion, trazodone and seroquel). Gastroesophageal Reflux She reports no abdominal pain, no chest pain, no coughing, no globus sensation, no heartburn or no wheezing. hx gastric bypass and Barrett's. Pertinent negatives include no fatigue. She has tried a PPI (she stopped this after normal EGD in 08/2022) for the symptoms.    Lab Results  Component Value Date   NA 144 04/28/2022   K 4.4 04/28/2022   CO2 22 04/28/2022   GLUCOSE 117 (H) 04/28/2022   BUN 19 04/28/2022   CREATININE 0.80 04/28/2022   CALCIUM 9.7 04/28/2022   EGFR 78 04/28/2022   Lab Results  Component Value Date   CHOL 161 04/28/2022   HDL 66 04/28/2022   LDLCALC 75 04/28/2022   TRIG 113  04/28/2022   CHOLHDL 2.4 04/28/2022   Lab Results  Component Value Date   TSH 3.020 04/28/2022   Lab Results  Component Value Date   HGBA1C 6.5 (A) 12/22/2022   Lab Results  Component Value Date   WBC 4.3 04/28/2022   HGB 12.3 04/28/2022   HCT 38.2 04/28/2022   MCV 88 04/28/2022   PLT 227 04/28/2022   Lab Results  Component Value Date   ALT 13 04/28/2022   AST 17 04/28/2022   ALKPHOS 59 04/28/2022   BILITOT 0.5 04/28/2022   No results found for: "25OHVITD2", "25OHVITD3", "VD25OH"   Review of Systems  Constitutional:  Negative for appetite change, fatigue, fever and unexpected weight change.  HENT:  Negative for nosebleeds, tinnitus and trouble swallowing.   Eyes:  Negative for visual disturbance.  Respiratory:  Negative for cough, chest tightness, shortness of breath and wheezing.   Cardiovascular:  Negative for chest pain, palpitations and leg swelling.  Gastrointestinal:  Negative for abdominal pain, constipation, diarrhea and heartburn.  Endocrine: Negative for polydipsia and polyuria.  Genitourinary:  Negative for dysuria and hematuria.       Incontinence since taking gabapentin  Musculoskeletal:  Positive for back pain. Negative for arthralgias.  Neurological:  Negative for dizziness, tremors, weakness, light-headedness, numbness and headaches.  Psychiatric/Behavioral:  Positive for depression. Negative for dysphoric mood.     Patient Active Problem List   Diagnosis Date Noted   Major depressive disorder with single episode, 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   Multilevel lumbosacral spondylosis with radiculopathy 08/31/2022   Urinary incontinence 04/27/2022   Sciatica  of left side 04/27/2022   Primary open angle glaucoma (POAG) of both eyes, moderate stage 04/27/2022   Barrett's esophagus 12/24/2021   Type II diabetes mellitus with complication (HCC) 12/25/2020   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   Spinal  stenosis 08/17/2012   Hyperlipidemia associated with type 2 diabetes mellitus (HCC) 08/17/1998   Essential hypertension 08/17/1988   Insomnia 08/17/1986    Allergies  Allergen Reactions   Diltiazem Hives    Past Surgical History:  Procedure Laterality Date   ABDOMINAL HYSTERECTOMY  1994   BARIATRIC SURGERY  2015   gastric sleeve   ESOPHAGOGASTRODUODENOSCOPY (EGD) WITH PROPOFOL N/A 09/07/2022   Procedure: ESOPHAGOGASTRODUODENOSCOPY (EGD) WITH PROPOFOL;  Surgeon: Midge Minium, MD;  Location: [facility];  Service: Endoscopy;  Laterality: N/A;   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   UPPER GI ENDOSCOPY  2023    Social History   Tobacco Use   Smoking status: Never   Smokeless tobacco: Never  Substance Use Topics   Alcohol use: Yes    Comment: rare   Drug use: No     Medication list has been reviewed and updated.  Current Meds  Medication Sig   atenolol (TENORMIN) 25 MG tablet TAKE 1 TABLET BY MOUTH DAILY   buPROPion (WELLBUTRIN XL) 300 MG 24 hr tablet TAKE 1 TABLET BY MOUTH DAILY   famotidine (PEPCID) 40 MG tablet Take 1 tablet (40 mg total) by mouth daily as needed for heartburn or indigestion.   gabapentin (NEURONTIN) 100 MG capsule Take 1 capsule (100 mg total) by mouth at bedtime as needed.   latanoprost (XALATAN) 0.005 % ophthalmic solution 1 drop at bedtime.   lidocaine (LIDODERM) 5 % Place 1 patch onto the skin every 12 (twelve) hours. Remove & Discard patch within 12 hours or as directed by MD   meloxicam (MOBIC) 7.5 MG tablet Take 1 tablet (7.5 mg total) by mouth daily as needed for pain. Take with food.   QUEtiapine (SEROQUEL) 25 MG tablet Take 1 tablet (25 mg total) by mouth at bedtime. As needed.   traZODone (DESYREL) 50 MG tablet Take 50 mg by mouth at bedtime.   UNABLE TO FIND Med Name: Nature's Bounty Vegitable and Fruit Supplement   [DISCONTINUED] atorvastatin (LIPITOR) 40 MG tablet Take 1 tablet (40 mg total) by mouth daily.   [DISCONTINUED]  escitalopram (LEXAPRO) 10 MG tablet TAKE 1 TABLET BY MOUTH DAILY       12/22/2022    3:12 PM 08/25/2022    2:29 PM 04/27/2022    2:52 PM  GAD 7 : Generalized Anxiety Score  Nervous, Anxious, on Edge 0 1 2  Control/stop worrying 0 2 2  Worry too much - different things 0 2 1  Trouble relaxing 3 0 3  Restless 0 0 1  Easily annoyed or irritable 0 0 1  Afraid - awful might happen 0 0 0  Total GAD 7 Score 3 5 10   Anxiety Difficulty Not difficult at all Not difficult at all Somewhat difficult       12/22/2022    3:12 PM 08/25/2022    2:29 PM 04/27/2022    2:51 PM  Depression screen PHQ 2/9  Decreased Interest 1 1 0  Down, Depressed, Hopeless 1 1 1   PHQ - 2 Score 2 2 1   Altered sleeping 1 3 3   Tired, decreased energy 2 2 3   Change in appetite 3 2 1   Feeling bad or failure about  yourself  0 2 0  Trouble concentrating 0 1 1  Moving slowly or fidgety/restless 2 1 0  Suicidal thoughts 0 0 0  PHQ-9 Score 10 13 9   Difficult doing work/chores Not difficult at all Somewhat difficult Somewhat difficult    BP Readings from Last 3 Encounters:  12/22/22 118/78  10/29/22 120/80  09/07/22 128/79    Physical Exam Vitals and nursing note reviewed.  Constitutional:      General: She is not in acute distress.    Appearance: She is well-developed.  HENT:     Head: Normocephalic and atraumatic.  Cardiovascular:     Rate and Rhythm: Normal rate and regular rhythm.     Pulses: Normal pulses.  Pulmonary:     Effort: Pulmonary effort is normal. No respiratory distress.     Breath sounds: No wheezing or rhonchi.  Musculoskeletal:        General: Normal range of motion.     Cervical back: Normal range of motion.     Right lower leg: No edema.     Left lower leg: No edema.  Lymphadenopathy:     Cervical: No cervical adenopathy.  Skin:    General: Skin is warm and dry.     Findings: No rash.  Neurological:     Mental Status: She is alert and oriented to person, place, and time.   Psychiatric:        Mood and Affect: Mood normal.        Behavior: Behavior normal.     Wt Readings from Last 3 Encounters:  12/22/22 177 lb (80.3 kg)  10/29/22 170 lb (77.1 kg)  09/07/22 170 lb 11.2 oz (77.4 kg)    BP 118/78   Pulse 76   Ht 5\' 6"  (1.676 m)   Wt 177 lb (80.3 kg)   SpO2 99%   BMI 28.57 kg/m   Assessment and Plan:  Problem List Items Addressed This Visit       Cardiovascular and Mediastinum   Essential hypertension - Primary (Chronic)    Stable exam with well controlled BP.  Currently taking atenolol. Tolerating medications without concerns or side effects. Will continue to recommend low sodium diet and current regimen.       Relevant Medications   atorvastatin (LIPITOR) 40 MG tablet     Digestive   Barrett's esophagus (Chronic)    Recent EGD  08/2022 - Small hiatal hernia. - Z- line irregular, at the gastroesophageal junction. 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 - Normal examined duodenum. - No specimens collected. Per Dr. Servando Snare, she can remain off of PPI if asymptomatic.        Endocrine   Hyperlipidemia associated with type 2 diabetes mellitus (HCC) (Chronic)   Relevant Medications   atorvastatin (LIPITOR) 40 MG tablet   Type II diabetes mellitus with complication (HCC) (Chronic)    Blood sugars stable without hypoglycemic symptoms or events. Currently being treated with metformin. Has gained 7 lbs which she attributes to gabapentin Lab Results  Component Value Date   HGBA1C 6.4 (H) 08/25/2022  A1C is 6.5 today. With a goal of < 8.0.       Relevant Medications   atorvastatin (LIPITOR) 40 MG tablet   Other Relevant Orders   POCT glycosylated hemoglobin (Hb A1C) (Completed)     Nervous and Auditory   Multilevel lumbosacral spondylosis with radiculopathy    Some benefit from gabapentin but having severe incontinence Referred for ESI so may be able  to stop gabapentin      Relevant Medications   escitalopram (LEXAPRO)  10 MG tablet     Other   Urinary incontinence    Much worse since taking gabapentin. Recommend that she stop it to determine benefit vs side effects      Major depressive disorder with single episode, in partial remission (HCC) (Chronic)    Clinically stable on current regimen with good control of symptoms, No SI or HI. On Lexapro, Bupropion, Trazodone and Seroquel long term. No change in management at this time.       Relevant Medications   escitalopram (LEXAPRO) 10 MG tablet    Return in about 4 months (around 04/24/2023) for HTN, DM, lipids - fasting.   Partially dictated using Dragon software, any errors are not intentional.  MAW was completed by CMA.  Reubin Milan, MD [facility] Medicine [place], [place]

## 2022-12-22 NOTE — Assessment & Plan Note (Addendum)
Blood sugars stable without hypoglycemic symptoms or events. Currently being treated with metformin. Has gained 7 lbs which she attributes to gabapentin Lab Results  Component Value Date   HGBA1C 6.4 (H) 08/25/2022  A1C is 6.5 today. With a goal of < 8.0.

## 2022-12-22 NOTE — Progress Notes (Signed)
Subjective:   Sheri Green is a 77 y.o. female who presents for an Initial Medicare Annual Wellness Visit.  I connected with  Eleonore Chiquito on 12/22/22 by an in person visit and verified that I am speaking with the correct person using two identifiers.  Patient Location: Other:  Chiropractor Location: Office/Clinic  I discussed the limitations of evaluation and management by telemedicine. The patient expressed understanding and agreed to proceed.   Review of Systems    Defer to PCP Cardiac Risk Factors include: advanced age (>46men, >56 women)     Objective:    Today's Vitals   12/22/22 1515 12/22/22 1516  Height: 5\' 6"  (1.676 m)   PainSc:  0-No pain   Body mass index is 28.57 kg/m.     12/22/2022    3:17 PM 09/07/2022   10:15 AM  Advanced Directives  Does Patient Have a Medical Advance Directive? No No  Would patient like information on creating a medical advance directive? No - Patient declined No - Patient declined    Current Medications (verified) Outpatient Encounter Medications as of 12/22/2022  Medication Sig   atenolol (TENORMIN) 25 MG tablet TAKE 1 TABLET BY MOUTH DAILY   buPROPion (WELLBUTRIN XL) 300 MG 24 hr tablet TAKE 1 TABLET BY MOUTH DAILY   famotidine (PEPCID) 40 MG tablet Take 1 tablet (40 mg total) by mouth daily as needed for heartburn or indigestion.   gabapentin (NEURONTIN) 100 MG capsule Take 1 capsule (100 mg total) by mouth at bedtime as needed.   latanoprost (XALATAN) 0.005 % ophthalmic solution 1 drop at bedtime.   lidocaine (LIDODERM) 5 % Place 1 patch onto the skin every 12 (twelve) hours. Remove & Discard patch within 12 hours or as directed by MD   meloxicam (MOBIC) 7.5 MG tablet Take 1 tablet (7.5 mg total) by mouth daily as needed for pain. Take with food.   metFORMIN (GLUCOPHAGE) 500 MG tablet TAKE 1 TABLET BY MOUTH TWICE  DAILY WITH MEALS   QUEtiapine (SEROQUEL) 25 MG tablet Take 1 tablet (25 mg total) by mouth at bedtime. As  needed.   traZODone (DESYREL) 50 MG tablet Take 50 mg by mouth at bedtime.   UNABLE TO FIND Med Name: Nature's Bounty Vegitable and Fruit Supplement   [DISCONTINUED] atorvastatin (LIPITOR) 40 MG tablet Take 1 tablet (40 mg total) by mouth daily.   [DISCONTINUED] escitalopram (LEXAPRO) 10 MG tablet TAKE 1 TABLET BY MOUTH DAILY   No facility-administered encounter medications on file as of 12/22/2022.    Allergies (verified) Diltiazem   History: Past Medical History:  Diagnosis Date   Barrett's esophagus determined by endoscopy    Diabetes mellitus without complication (HCC)    Glaucoma    Hypertension    Sciatica of left side    Urinary incontinence    Past Surgical History:  Procedure Laterality Date   ABDOMINAL HYSTERECTOMY  1994   BARIATRIC SURGERY  2015   gastric sleeve   ESOPHAGOGASTRODUODENOSCOPY (EGD) WITH PROPOFOL N/A 09/07/2022   Procedure: ESOPHAGOGASTRODUODENOSCOPY (EGD) WITH PROPOFOL;  Surgeon: Midge Minium, MD;  Location: [facility];  Service: Endoscopy;  Laterality: N/A;   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   UPPER GI ENDOSCOPY  2023   Family History  Problem Relation Age of Onset   Bladder Cancer Mother    Heart disease Father    Glaucoma Father    Hypertension Father    Heart attack Father    Hyperlipidemia Sister  Hypertension Brother    Social History   Socioeconomic History   Marital status: Married    Spouse name: Not on file   Number of children: Not on file   Years of education: Not on file   Highest education level: Not on file  Occupational History   Not on file  Tobacco Use   Smoking status: Never   Smokeless tobacco: Never  Vaping Use   Vaping Use: Not on file  Substance and Sexual Activity   Alcohol use: Yes    Comment: rare   Drug use: No   Sexual activity: Not Currently    Birth control/protection: None  Other Topics Concern   Not on file  Social History Narrative   Not on file   Social Determinants of Health    Financial Resource Strain: Low Risk  (12/22/2022)   Overall Financial Resource Strain (CARDIA)    Difficulty of Paying Living Expenses: Not hard at all  Food Insecurity: No Food Insecurity (12/22/2022)   Hunger Vital Sign    Worried About Running Out of Food in the Last Year: Never true    Ran Out of Food in the Last Year: Never true  Transportation Needs: No Transportation Needs (12/22/2022)   PRAPARE - Administrator, Civil Service (Medical): No    Lack of Transportation (Non-Medical): No  Physical Activity: Inactive (12/22/2022)   Exercise Vital Sign    Days of Exercise per Week: 0 days    Minutes of Exercise per Session: 0 min  Stress: No Stress Concern Present (12/22/2022)   [employer] of Occupational Health - Occupational Stress Questionnaire    Feeling of Stress : Not at all  Social Connections: Not on file    Tobacco Counseling Counseling given: Not Answered   Clinical Intake:  Pre-visit preparation completed: Yes  Pain : No/denies pain Pain Score: 0-No pain     BMI - recorded: 28.57 Nutritional Status: BMI 25 -29 Overweight Nutritional Risks: None Diabetes: No  How often do you need to have someone help you when you read instructions, pamphlets, or other written materials from your doctor or pharmacy?: 1 - Never  Diabetic? No  Interpreter Needed?: No  Information entered by :: Margaretha Sheffield, CMA   Activities of Daily Living    12/22/2022    3:17 PM 09/07/2022    9:54 AM  In your present state of health, do you have any difficulty performing the following activities:  Hearing? 0 0  Vision? 0 0  Difficulty concentrating or making decisions? 0 0  Walking or climbing stairs? 1 0  Dressing or bathing? 0 0  Doing errands, shopping? 0   Preparing Food and eating ? N   Using the Toilet? N   In the past six months, have you accidently leaked urine? Y   Do you have problems with loss of bowel control? N   Managing your Medications? N    Managing your Finances? N   Housekeeping or managing your Housekeeping? N     Patient Care Team: Reubin Milan, MD as PCP - General (Internal Medicine)  Indicate any recent Medical Services you may have received from other than [facility] providers in the past year (date may be approximate).     Assessment:   This is a routine wellness examination for [place].  Hearing/Vision screen - No concerns at this time.  Dietary issues and exercise activities discussed: Current Exercise Habits: The patient does not participate in regular exercise at present  Goals Addressed   None   Depression Screen    12/22/2022    3:12 PM 08/25/2022    2:29 PM 04/27/2022    2:51 PM  PHQ 2/9 Scores  PHQ - 2 Score 2 2 1   PHQ- 9 Score 10 13 9     Fall Risk    12/22/2022    3:12 PM 08/25/2022    2:29 PM 04/27/2022    2:52 PM  Fall Risk   Falls in the past year? 0 1 0  Number falls in past yr: 0 1 0  Injury with Fall? 0 0 0  Risk for fall due to : No Fall Risks History of fall(s) No Fall Risks  Follow up Falls evaluation completed Falls evaluation completed Falls evaluation completed    FALL RISK PREVENTION PERTAINING TO THE HOME:  Any stairs in or around the home? Yes  If so, are there any without handrails? No  Home free of loose throw rugs in walkways, pet beds, electrical cords, etc? Yes  Adequate lighting in your home to reduce risk of falls? Yes   ASSISTIVE DEVICES UTILIZED TO PREVENT FALLS:  Life alert? No  Use of a cane, walker or w/c? No   TIMED UP AND GO:  Was the test performed? Yes .  Gait steady and fast without use of assistive device  Immunizations Immunization History  Administered Date(s) Administered   Influenza, High Dose Seasonal PF 06/26/2020, 06/10/2021   Moderna Sars-Covid-2 Vaccination 09/04/2019, 10/02/2019, 05/24/2020   Pneumococcal Conjugate-13 01/28/2015, 09/23/2015   Pneumococcal Polysaccharide-23 10/05/2016   Zoster Recombinat (Shingrix) 10/19/2017    Zoster, Live 06/26/2016    TDAP status: Due, Education has been provided regarding the importance of this vaccine. Advised may receive this vaccine at local pharmacy or Health Dept. Aware to provide a copy of the vaccination record if obtained from local pharmacy or Health Dept. Verbalized acceptance and understanding.  Flu Vaccine status: Up to date  Pneumococcal vaccine status: Up to date  Covid-19 vaccine status: Completed vaccines  Qualifies for Shingles Vaccine? Yes   Zostavax completed No   Shingrix Completed?: No.    Education has been provided regarding the importance of this vaccine. Patient has been advised to call insurance company to determine out of pocket expense if they have not yet received this vaccine. Advised may also receive vaccine at local pharmacy or Health Dept. Verbalized acceptance and understanding.  Screening Tests Health Maintenance  Topic Date Due   DTaP/Tdap/Td (1 - Tdap) Never done   DEXA SCAN  Never done   Zoster Vaccines- Shingrix (2 of 2) 12/14/2017   COVID-19 Vaccine (4 - 2023-24 season) 04/17/2022   HEMOGLOBIN A1C  02/23/2023   INFLUENZA VACCINE  03/18/2023   FOOT EXAM  04/28/2023   Diabetic kidney evaluation - eGFR measurement  04/29/2023   Diabetic kidney evaluation - Urine ACR  08/26/2023   OPHTHALMOLOGY EXAM  10/27/2023   Medicare Annual Wellness (AWV)  12/22/2023   MAMMOGRAM  12/16/2024   COLONOSCOPY (Pts 45-44yrs Insurance coverage will need to be confirmed)  12/23/2031   Pneumonia Vaccine 68+ Years old  Completed   Hepatitis C Screening  Completed   HPV VACCINES  Aged Out    Health Maintenance  Health Maintenance Due  Topic Date Due   DTaP/Tdap/Td (1 - Tdap) Never done   DEXA SCAN  Never done   Zoster Vaccines- Shingrix (2 of 2) 12/14/2017   COVID-19 Vaccine (4 - 2023-24 season) 04/17/2022    Colorectal cancer  screening: Type of screening: Colonoscopy. Completed 12/22/21. Repeat every 10 years  Mammogram status: Completed  12/17/2022. Repeat every year  Lung Cancer Screening: (Low Dose CT Chest recommended if Age 41-80 years, 30 pack-year currently smoking OR have quit w/in 15years.) does not qualify.   Additional Screening:  Hepatitis C Screening: does qualify; Completed 08/25/22  Vision Screening: Recommended annual ophthalmology exams for early detection of glaucoma and other disorders of the eye. Is the patient up to date with their annual eye exam?  Yes  Who is the provider or what is the name of the office in which the patient attends annual eye exams? [facility] [place]  Dental Screening: Recommended annual dental exams for proper oral hygiene  Community Resource Referral / Chronic Care Management: CRR required this visit?  No   CCM required this visit?  No      Plan:     I have personally reviewed and noted the following in the patient's chart:   Medical and social history Use of alcohol, tobacco or illicit drugs  Current medications and supplements including opioid prescriptions. Patient is not currently taking opioid prescriptions. Functional ability and status Nutritional status Physical activity Advanced directives List of other physicians Hospitalizations, surgeries, and ER visits in previous 12 months Vitals Screenings to include cognitive, depression, and falls Referrals and appointments  In addition, I have reviewed and discussed with patient certain preventive protocols, quality metrics, and best practice recommendations. A written personalized care plan for preventive services as well as general preventive health recommendations were provided to patient.     Mariel Sleet, CMA   12/22/2022   Nurse Notes: None.

## 2022-12-22 NOTE — Telephone Encounter (Signed)
Requested medication (s) are due for refill today: yes  Requested medication (s) are on the active medication list: yes  Last refill:  06/17/22 #90  Future visit scheduled: today 12/22/22  Notes to clinic:  pt has OV today   Requested Prescriptions  Pending Prescriptions Disp Refills   atenolol (TENORMIN) 25 MG tablet [Pharmacy Med Name: Atenolol 25 MG Oral Tablet] 90 tablet 3    Sig: TAKE 1 TABLET BY MOUTH DAILY     Cardiovascular: Beta Blockers 2 Passed - 12/21/2022  5:46 AM      Passed - Cr in normal range and within 360 days    Creatinine, Ser  Date Value Ref Range Status  04/28/2022 0.80 0.57 - 1.00 mg/dL Final         Passed - Last BP in normal range    BP Readings from Last 1 Encounters:  10/29/22 120/80         Passed - Last Heart Rate in normal range    Pulse Readings from Last 1 Encounters:  10/29/22 82         Passed - Valid encounter within last 6 months    Recent Outpatient Visits           1 month ago Multilevel lumbosacral spondylosis with radiculopathy   [place] [facility], Ocie Bob, MD   3 months ago Multilevel lumbosacral spondylosis with radiculopathy   [place] [facility], Ocie Bob, MD   3 months ago Type II diabetes mellitus with complication [facility])   [place] [facility] at [facility], Nyoka Cowden, MD   7 months ago Essential hypertension   [place] [facility] at [facility], Nyoka Cowden, MD       Future Appointments             Today Reubin Milan, MD [facility] at [facility], [facility]

## 2023-01-12 ENCOUNTER — Telehealth: Payer: Self-pay | Admitting: *Deleted

## 2023-01-12 ENCOUNTER — Telehealth: Payer: Self-pay | Admitting: Student in an Organized Health Care Education/Training Program

## 2023-01-12 ENCOUNTER — Encounter: Payer: Self-pay | Admitting: Student in an Organized Health Care Education/Training Program

## 2023-01-12 ENCOUNTER — Ambulatory Visit
Payer: Medicare Other | Attending: Student in an Organized Health Care Education/Training Program | Admitting: Student in an Organized Health Care Education/Training Program

## 2023-01-12 VITALS — BP 136/90 | Temp 97.3°F | Resp 18 | Ht 67.0 in | Wt 178.9 lb

## 2023-01-12 DIAGNOSIS — M5136 Other intervertebral disc degeneration, lumbar region: Secondary | ICD-10-CM

## 2023-01-12 DIAGNOSIS — M47816 Spondylosis without myelopathy or radiculopathy, lumbar region: Secondary | ICD-10-CM | POA: Diagnosis present

## 2023-01-12 DIAGNOSIS — M5416 Radiculopathy, lumbar region: Secondary | ICD-10-CM | POA: Diagnosis present

## 2023-01-12 DIAGNOSIS — M48062 Spinal stenosis, lumbar region with neurogenic claudication: Secondary | ICD-10-CM | POA: Diagnosis present

## 2023-01-12 DIAGNOSIS — M51369 Other intervertebral disc degeneration, lumbar region without mention of lumbar back pain or lower extremity pain: Secondary | ICD-10-CM | POA: Insufficient documentation

## 2023-01-12 DIAGNOSIS — G8929 Other chronic pain: Secondary | ICD-10-CM | POA: Diagnosis present

## 2023-01-12 MED ORDER — LYRICA 25 MG PO CAPS: ORAL_CAPSULE | ORAL | 0 refills | Status: DC

## 2023-01-12 NOTE — Progress Notes (Signed)
Patient: Sheri Green  Service Category: E/M  Provider: Edward Jolly, MD  DOB: 09-Dec-1945  DOS: 01/12/2023  Referring Provider: Jerrol Banana, MD  MRN: [id number]  Setting: Ambulatory outpatient  PCP: Reubin Milan, MD  Type: New Patient  Specialty: Interventional Pain Management    Location: Office  Delivery: Face-to-face     Primary Reason(s) for Visit: Encounter for initial evaluation of one or more chronic problems (new to examiner) potentially causing chronic pain, and posing a threat to normal musculoskeletal function. (Level of risk: High) CC: Back Pain (low) and Shoulder Pain (bilateral)  HPI  Sheri Green is a 77 y.o. year old, female patient, who comes for the first time to our practice referred by Jerrol Banana, MD for our initial evaluation of her chronic pain. She has Type II diabetes mellitus with complication (HCC); Spinal stenosis, lumbar region, with neurogenic claudication; Recurrent genital herpes simplex; Insomnia; Hyperlipidemia associated with type 2 diabetes mellitus (HCC); Essential hypertension; Barrett's esophagus; History of gastric bypass; Urinary incontinence; Sciatica of left side; Primary open angle glaucoma (POAG) of both eyes, moderate stage; Multilevel lumbosacral spondylosis with radiculopathy; Major depressive disorder with single episode, in partial remission (HCC); Lumbar spondylosis; Lumbar degenerative disc disease; and Chronic radicular lumbar pain on their problem list. Today she comes in for evaluation of her Back Pain (low) and Shoulder Pain (bilateral)  Pain Assessment: Location: Lower Back Radiating: left leg laterally to ankle and right buttock pain. Onset: More than a month ago Duration: Chronic pain Quality: Tightness, Aching, Constant Severity: 5 /10 (subjective, self-reported pain score)  Effect on ADL:   Timing: Constant Modifying factors: Gabapentin(but caused intolerable side effects), heat ice, rest, topicals BP: (!) 136/90  HR:     Onset and Duration: Gradual and Date of onset: 12/04/2021 Cause of pain: Unknown Severity: Getting worse, NAS-11 at its worse: 8/10, NAS-11 at its best: 5/10, NAS-11 now: 4/10, and NAS-11 on the average: 4/10 Timing: Afternoon, Evening, During activity or exercise, and After a period of immobility Aggravating Factors: Bending, Climbing, Kneeling, Lifiting, Prolonged sitting, Prolonged standing, Squatting, Twisting, Walking, and Walking downhill Alleviating Factors: Cold packs, Hot packs, Lying down, Medications, Resting, TENS, Warm showers or baths, and Chiropractic manipulations Associated Problems: Fatigue, Inability to control bladder (urine), Numbness, Swelling, Temperature changes, Weakness, Pain that wakes patient up, and Pain that does not allow patient to sleep Quality of Pain: Aching, Constant, Deep, Exhausting, Getting longer, Heavy, Horrible, Nagging, Pressure-like, Sharp, and Throbbing Previous Examinations or Tests: MRI scan and Orthopedic evaluation Previous Treatments: Chiropractic manipulations, Narcotic medications, Pool exercises, and Stretching exercises  Sheri Green is being evaluated for possible interventional pain management therapies for the treatment of her chronic pain.   Sheri Green is a pleasant 77 year old female who presents with a chief complaint of low back pain with radiation into her left buttock region.  She states that this pain started in April 2023.  No inciting or traumatic event.  At the time of pain onset, she was having pain radiation down into her left lateral leg in a dermatomal fashion.  She was started on gabapentin 100 mg 3 times a day.  While this helped with her left lower extremity radiating pain, she experienced incontinence, urinary, which led her to discontinue the medication.  She is primarily complaining of left low back pain.  This is worse with lumbar extension.  She does ambulate with a cane at times.  She states that she does have to bend forward  to alleviate her  pain or sit down to rest.  She walks around the grocery store with a shopping cart and she states that helps her symptoms.  She has done pool exercises and stretching exercises in the past.  She tries to do stretching exercises at home.  She is also seen a [facility].  She has a lumbar MRI, results of which are below.  Meds   Current Outpatient Medications:    atenolol (TENORMIN) 25 MG tablet, TAKE 1 TABLET BY MOUTH DAILY, Disp: 90 tablet, Rfl: 1   atorvastatin (LIPITOR) 40 MG tablet, Take 1 tablet (40 mg total) by mouth daily., Disp: 90 tablet, Rfl: 1   buPROPion (WELLBUTRIN XL) 300 MG 24 hr tablet, TAKE 1 TABLET BY MOUTH DAILY, Disp: 90 tablet, Rfl: 1   escitalopram (LEXAPRO) 10 MG tablet, Take 1 tablet (10 mg total) by mouth daily., Disp: 90 tablet, Rfl: 1   famotidine (PEPCID) 40 MG tablet, Take 1 tablet (40 mg total) by mouth daily as needed for heartburn or indigestion., Disp: 30 tablet, Rfl: 2   latanoprost (XALATAN) 0.005 % ophthalmic solution, 1 drop at bedtime., Disp: , Rfl:    LYRICA 25 MG capsule, Take 1 capsule (25 mg total) by mouth at bedtime for 7 days, THEN 1 capsule (25 mg total) 2 (two) times daily for 23 days., Disp: 53 capsule, Rfl: 0   metFORMIN (GLUCOPHAGE) 500 MG tablet, TAKE 1 TABLET BY MOUTH TWICE  DAILY WITH MEALS, Disp: 180 tablet, Rfl: 3   QUEtiapine (SEROQUEL) 25 MG tablet, Take 1 tablet (25 mg total) by mouth at bedtime. As needed., Disp: 90 tablet, Rfl: 0   traZODone (DESYREL) 50 MG tablet, Take 50 mg by mouth at bedtime., Disp: , Rfl:    UNABLE TO FIND, Med Name: Nature's Bounty Vegitable and Fruit Supplement, Disp: , Rfl:    gabapentin (NEURONTIN) 100 MG capsule, Take 1 capsule (100 mg total) by mouth at bedtime as needed. (Patient not taking: Reported on 01/12/2023), Disp: 30 capsule, Rfl: 2   lidocaine (LIDODERM) 5 %, Place 1 patch onto the skin every 12 (twelve) hours. Remove & Discard patch within 12 hours or as directed by MD (Patient not  taking: Reported on 01/12/2023), Disp: 30 patch, Rfl: 2   meloxicam (MOBIC) 7.5 MG tablet, Take 1 tablet (7.5 mg total) by mouth daily as needed for pain. Take with food. (Patient not taking: Reported on 01/12/2023), Disp: 30 tablet, Rfl: 2  Imaging Review  Cervical Imaging: Cervical MR wo contrast: No results found for this or any previous visit.  Cervical MR wo contrast: No valid procedures specified. Cervical MR w/wo contrast: No results found for this or any previous visit.  Cervical MR w contrast: No results found for this or any previous visit.  Cervical CT wo contrast: Results for orders placed during the hospital encounter of 10/29/12  CT Cervical Spine Wo Contrast  Narrative *RADIOLOGY REPORT*  Clinical Data:  The patient tripped and fell at work today.  Hit head on counter.  No loss of consciousness.  A small laceration above right eyebrow.  Neck pain.  CT CERVICAL SPINE WITHOUT CONTRAST  Technique:  Multidetector CT imaging of the cervical spine was performed without intravenous contrast.  Multiplanar CT image reconstructions were also generated.  Comparison:   None.  Findings:  There are degenerative changes in the cervical spine. No evidence for acute fracture or subluxation.  There is moderate vertebral canal narrowing at C6-7, lower lobe related to significant degenerative change.  Bilateral foraminal  narrowing is identified at the same level.  Lung apices are clear. There is a small bone density adjacent to the medial aspect of the right clavicle, consistent with old injury or degenerative change.  IMPRESSION:    1.  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  2.  No evidence for acute fracture or subluxation.  CT MAXILLOFACIAL WITHOUT CONTRAST  Technique:  Multidetector CT imaging of the maxillofacial structures was performed.  Multiplanar CT image reconstructions were also generated.  A small metallic BB was placed on  the right temple in order to reliably differentiate right from left.  Comparison:  None  Findings:  The orbits, temporal mandibular joints, mandible, pterygoid plates, zygomatic arches, nasal bones are intact.  The globes are symmetric and intact bilaterally.  No significant pre or post septal edema.  The visualized paranasal and mastoid air cells are normally aerated.  IMPRESSION:  No evidence for acute maxillofacial injury.   Original Report Authenticated By: Norva Pavlov, M.D.   MR LUMBAR SPINE WO CONTRAST  Narrative CLINICAL DATA:  Low back pain, spondyloarthropathy suspected. Radiation down left leg x4 months.  EXAM: MRI LUMBAR SPINE WITHOUT CONTRAST  TECHNIQUE: Multiplanar, multisequence MR imaging of the lumbar spine was performed. No intravenous contrast was administered.  COMPARISON:  Lumbar spine radiographs 04/27/2022.  FINDINGS: Segmentation: Transitional anatomy. To maintain consistency with the prior radiography report, the lowest fully formed disc space with acquired fusion is labeled L5-S1 and hypoplastic ribs are assumed at T12.  Alignment:  Unchanged grade 1 anterolisthesis of L4 on L5.  Vertebrae: Modic type 1 degenerative endplate marrow signal changes at L4-5.  Conus medullaris and cauda equina: Conus extends to the L1 level. Conus and cauda equina appear normal.  Paraspinal and other soft tissues: Moderate fatty atrophy of the paraspinal muscles.  Disc levels:  Mild degenerative changes of the bilateral SI joints.  T12-L1: Small disc bulge and mild bilateral facet arthropathy. No spinal canal stenosis or neural foraminal narrowing.  L1-L2: Small disc bulge and mild bilateral facet arthropathy. No spinal canal stenosis or neural foraminal narrowing.  L2-L3: Small disc bulge and moderate bilateral facet arthropathy results in mild spinal canal stenosis. No significant neural foraminal narrowing.  L3-L4: Disc bulge and moderate  bilateral facet arthropathy results mild-to-moderate spinal canal stenosis and mild bilateral neural foraminal narrowing.  L4-L5: Anterolisthesis, disc bulge and left-greater-than-right facet arthropathy results in compression of the left-greater-than-right traversing L5 nerve roots in the lateral recesses with mild central spinal canal stenosis and mild bilateral neural foraminal narrowing.  L5-S1: Acquired fusion. Calcified left central disc-osteophyte complex results in compression of the traversing left S1 nerve root in the lateral recess. Mild bilateral facet arthropathy contributes to moderate right mild left neural foraminal narrowing.  IMPRESSION: 1. Transitional anatomy. To maintain consistency with the prior radiography report, the lowest fully formed disc space with acquired fusion is labeled L5-S1 and hypoplastic ribs are assumed at T12. 2. At L4-5, anterolisthesis, disc bulge and facet arthropathy result in compression of the left-greater-than-right traversing L5 nerve roots in the lateral recesses with mild central spinal canal stenosis. 3. At L5-S1, calcified left central disc-osteophyte complex results in compression of the traversing left S1 nerve root in the lateral recess. Moderate right neural foraminal narrowing at this level. 4. Mild-to-moderate spinal canal stenosis at L3-4.   Electronically Signed By: Orvan Falconer M.D. On: 11/12/2022 08:48  Narrative CLINICAL DATA:  Sciatica left low back pain  EXAM: LUMBAR SPINE - COMPLETE 4+ VIEW  COMPARISON:  None Available.  FINDINGS: Hypoplastic ribs will be assumed at T12. Trace anterolisthesis L4 on L5. Vertebral body heights are maintained. Moderate disc space narrowing at L3-L4 and L4-L5 with advanced disc space narrowing at L5-S1. Facet degenerative changes of the lower lumbar spine. Mild to moderate degenerative change L1-L2.  IMPRESSION: 1. Grade 1 anterolisthesis L4 on L5 with multilevel  degenerative changes, worst at L5-S1   Electronically Signed By: Jasmine Pang M.D. On: 04/28/2022 21:07  Complexity Note: Imaging results reviewed.                         ROS  Cardiovascular: High blood pressure and Needs antibiotics prior to dental procedures Pulmonary or Respiratory: No reported pulmonary signs or symptoms such as wheezing and difficulty taking a deep full breath (Asthma), difficulty blowing air out (Emphysema), coughing up mucus (Bronchitis), persistent dry cough, or temporary stoppage of breathing during sleep Neurological: No reported neurological signs or symptoms such as seizures, abnormal skin sensations, urinary and/or fecal incontinence, being born with an abnormal open spine and/or a tethered spinal cord Psychological-Psychiatric: Anxiousness and Depressed Gastrointestinal: Reflux or heatburn Genitourinary: No reported renal or genitourinary signs or symptoms such as difficulty voiding or producing urine, peeing blood, non-functioning kidney, kidney stones, difficulty emptying the bladder, difficulty controlling the flow of urine, or chronic kidney disease Hematological: Brusing easily Endocrine: High blood sugar controlled without the use of insulin (NIDDM) Rheumatologic: Joint aches and or swelling due to excess weight (Osteoarthritis) Musculoskeletal: Negative for myasthenia gravis, muscular dystrophy, multiple sclerosis or malignant hyperthermia Work History: No answer.   Allergies  Sheri Green is allergic to diltiazem.  Laboratory Chemistry Profile   Renal Lab Results  Component Value Date   BUN 19 04/28/2022   CREATININE 0.80 04/28/2022   BCR 24 04/28/2022     Electrolytes Lab Results  Component Value Date   NA 144 04/28/2022   K 4.4 04/28/2022   CL 104 04/28/2022   CALCIUM 9.7 04/28/2022     Hepatic Lab Results  Component Value Date   AST 17 04/28/2022   ALT 13 04/28/2022   ALBUMIN 4.5 04/28/2022   ALKPHOS 59 04/28/2022      ID No results found for: "LYMEIGGIGMAB", "HIV", "SARSCOV2NAA", "STAPHAUREUS", "MRSAPCR", "HCVAB", "PREGTESTUR", "RMSFIGG", "QFVRPH1IGG", "QFVRPH2IGG"   Bone No results found for: "VD25OH", "VD125OH2TOT", "ZO1096EA5", "WU9811BJ4", "25OHVITD1", "25OHVITD2", "25OHVITD3", "TESTOFREE", "TESTOSTERONE"   Endocrine Lab Results  Component Value Date   GLUCOSE 117 (H) 04/28/2022   HGBA1C 6.5 (A) 12/22/2022   TSH 3.020 04/28/2022     Neuropathy Lab Results  Component Value Date   HGBA1C 6.5 (A) 12/22/2022     CNS No results found for: "COLORCSF", "APPEARCSF", "RBCCOUNTCSF", "WBCCSF", "POLYSCSF", "LYMPHSCSF", "EOSCSF", "PROTEINCSF", "GLUCCSF", "JCVIRUS", "CSFOLI", "IGGCSF", "LABACHR", "ACETBL"   Inflammation (CRP: Acute  ESR: Chronic) No results found for: "CRP", "ESRSEDRATE", "LATICACIDVEN"   Rheumatology No results found for: "RF", "ANA", "LABURIC", "URICUR", "LYMEIGGIGMAB", "LYMEABIGMQN", "HLAB27"   Coagulation Lab Results  Component Value Date   PLT 227 04/28/2022     Cardiovascular Lab Results  Component Value Date   HGB 12.3 04/28/2022   HCT 38.2 04/28/2022     Screening No results found for: "SARSCOV2NAA", "COVIDSOURCE", "STAPHAUREUS", "MRSAPCR", "HCVAB", "HIV", "PREGTESTUR"   Cancer No results found for: "CEA", "CA125", "LABCA2"   Allergens No results found for: "ALMOND", "APPLE", "ASPARAGUS", "AVOCADO", "BANANA", "BARLEY", "BASIL", "BAYLEAF", "GREENBEAN", "LIMABEAN", "WHITEBEAN", "BEEFIGE", "REDBEET", "BLUEBERRY", "BROCCOLI", "CABBAGE", "MELON", "CARROT", "CASEIN", "CASHEWNUT", "CAULIFLOWER", "CELERY"  Note: Lab results reviewed.  PFSH  Drug: Sheri Green  reports no history of drug use. Alcohol:  reports current alcohol use. Tobacco:  reports that she has never smoked. She has never used smokeless tobacco. Medical:  has a past medical history of Barrett's esophagus determined by endoscopy, Diabetes mellitus without complication (HCC), Glaucoma, Hypertension,  Sciatica of left side, and Urinary incontinence. Family: family history includes Bladder Cancer in her mother; Glaucoma in her father; Heart attack in her father; Heart disease in her father; Hyperlipidemia in her sister; Hypertension in her brother and father.  Past Surgical History:  Procedure Laterality Date   ABDOMINAL HYSTERECTOMY  1994   BARIATRIC SURGERY  2015   gastric sleeve   ESOPHAGOGASTRODUODENOSCOPY (EGD) WITH PROPOFOL N/A 09/07/2022   Procedure: ESOPHAGOGASTRODUODENOSCOPY (EGD) WITH PROPOFOL;  Surgeon: Midge Minium, MD;  Location: [facility];  Service: Endoscopy;  Laterality: N/A;   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   UPPER GI ENDOSCOPY  2023   Active Ambulatory Problems    Diagnosis Date Noted   Type II diabetes mellitus with complication (HCC) 12/25/2020   Spinal stenosis, lumbar region, 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   Insomnia 08/17/1986   Hyperlipidemia associated with type 2 diabetes mellitus (HCC) 08/17/1998   Essential hypertension 08/17/1988   Barrett's esophagus 12/24/2021   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   Urinary incontinence 04/27/2022   Sciatica of left side 04/27/2022   Primary open angle glaucoma (POAG) of both eyes, moderate stage 04/27/2022   Multilevel lumbosacral spondylosis with radiculopathy 08/31/2022   Major depressive disorder with single episode, 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   Lumbar spondylosis 01/12/2023   Lumbar degenerative disc disease 01/12/2023   Chronic radicular lumbar pain 01/12/2023   Resolved Ambulatory Problems    Diagnosis Date Noted   Recurrent major depression in full remission (HCC) 02/07/2020   Past Medical History:  Diagnosis Date   Barrett's esophagus determined by endoscopy    Diabetes mellitus without complication (HCC)    Glaucoma    Hypertension    Constitutional Exam  General appearance: Well nourished, well developed, and well  hydrated. In no apparent acute distress Vitals:   01/12/23 1110  BP: (!) 136/90  Resp: 18  Temp: (!) 97.3 F (36.3 C)  TempSrc: Temporal  Weight: 178 lb 14.4 oz (81.1 kg)  Height: 5\' 7"  (1.702 m)   BMI Assessment: Estimated body mass index is 28.02 kg/m as calculated from the following:   Height as of this encounter: 5\' 7"  (1.702 m).   Weight as of this encounter: 178 lb 14.4 oz (81.1 kg).  BMI interpretation table: BMI level Category Range association with higher incidence of chronic pain  <18 kg/m2 Underweight   18.5-24.9 kg/m2 Ideal body weight   25-29.9 kg/m2 Overweight Increased incidence by 20%  30-34.9 kg/m2 Obese (Class I) Increased incidence by 68%  35-39.9 kg/m2 Severe obesity (Class II) Increased incidence by 136%  >40 kg/m2 Extreme obesity (Class III) Increased incidence by 254%   Patient's current BMI Ideal Body weight  Body mass index is 28.02 kg/m. Ideal body weight: 61.6 kg (135 lb 12.9 oz) Adjusted ideal body weight: 69.4 kg (153 lb 0.7 oz)   BMI Readings from Last 4 Encounters:  01/12/23 28.02 kg/m  12/22/22 28.57 kg/m  12/22/22 28.57 kg/m  10/29/22 27.44 kg/m   Wt Readings from Last 4 Encounters:  01/12/23 178 lb 14.4 oz (81.1 kg)  12/22/22 177 lb (80.3 kg)  10/29/22  170 lb (77.1 kg)  09/07/22 170 lb 11.2 oz (77.4 kg)    Psych/Mental status: Alert, oriented x 3 (person, place, & time)       Eyes: PERLA Respiratory: No evidence of acute respiratory distress  Lumbar Spine Area Exam  Skin & Axial Inspection: No masses, redness, or swelling Alignment: Symmetrical Functional ROM: Pain restricted ROM affecting primarily the left Stability: No instability detected Muscle Tone/Strength: Functionally intact. No obvious neuro-muscular anomalies detected. Sensory (Neurological): Musculoskeletal pain pattern Palpation: No palpable anomalies       Provocative Tests: Hyperextension/rotation test: (+) on the left for facet joint pain. Lumbar quadrant  test (Kemp's test): (+) on the left for facet joint pain.  Gait & Posture Assessment  Ambulation: Limited Gait: Age-related, senile gait pattern Posture: WNL  Lower Extremity Exam    Side: Right lower extremity  Side: Left lower extremity  Stability: No instability observed          Stability: No instability observed          Skin & Extremity Inspection: Evidence of prior arthroplastic surgery  Skin & Extremity Inspection: Skin color, temperature, and hair growth are WNL. No peripheral edema or cyanosis. No masses, redness, swelling, asymmetry, or associated skin lesions. No contractures.  Functional ROM: Unrestricted ROM                  Functional ROM: Unrestricted ROM                  Muscle Tone/Strength: Functionally intact. No obvious neuro-muscular anomalies detected.  Muscle Tone/Strength: Functionally intact. No obvious neuro-muscular anomalies detected.  Sensory (Neurological): Unimpaired        Sensory (Neurological): Musculoskeletal pain pattern        DTR: Patellar: deferred today Achilles: deferred today Plantar: deferred today  DTR: Patellar: deferred today Achilles: deferred today Plantar: deferred today  Palpation: No palpable anomalies  Palpation: No palpable anomalies    Assessment  Primary Diagnosis & Pertinent Problem List: The primary encounter diagnosis was Lumbar facet arthropathy. Diagnoses of Lumbar spondylosis, Lumbar degenerative disc disease, Chronic radicular lumbar pain, and Spinal stenosis, lumbar region, with neurogenic claudication were also pertinent to this visit.  Visit Diagnosis (New problems to examiner): 1. Lumbar facet arthropathy   2. Lumbar spondylosis   3. Lumbar degenerative disc disease   4. Chronic radicular lumbar pain   5. Spinal stenosis, lumbar region, with neurogenic claudication    Plan of Care (Initial workup plan)  Sheri Green has a history of greater than 3 months of moderate to severe pain which is resulted in functional  impairment.  The patient has tried various conservative therapeutic options such as NSAIDs, Tylenol, muscle relaxants, physical therapy which was inadequately effective.  Patient's pain is predominantly axial with physical exam and lumbar MRI findings suggestive of facet arthropathy.  Lumbar facet medial branch nerve blocks were discussed with the patient.  Risks and benefits were reviewed.  Patient would like to proceed with bilateral L3, L4, L5 medial branch nerve block.  We also discussed lumbar radiofrequency ablation as well as Sprint peripheral nerve stimulation of the lumbar medial branch nerves if she does have a positive response to her to diagnostic lumbar facet medial branch nerve blocks.  Of note the patient also has a left ring trigger finger and we discussed a left ring trigger finger injection in the future.   Procedure Orders         LUMBAR FACET(MEDIAL BRANCH NERVE BLOCK) MBNB  Pharmacotherapy (current): Medications ordered:  Meds ordered this encounter  Medications   LYRICA 25 MG capsule    Sig: Take 1 capsule (25 mg total) by mouth at bedtime for 7 days, THEN 1 capsule (25 mg total) 2 (two) times daily for 23 days.    Dispense:  53 capsule    Refill:  0   Medications administered during this visit: Kelbie L. Brookings had no medications administered during this visit.      Interventional management options: Sheri Green was informed that there is no guarantee that she would be a candidate for interventional therapies. The decision will be based on the results of diagnostic studies, as well as Sheri Green's risk profile.  Procedure(s) under consideration:  Lumbar L4-L5 epidural steroid injection Minimally invasive lumbar decompression, mild, for lumbar spinal stenosis Left ring trigger finger injection   Provider-requested follow-up: Return in about 15 days (around 01/27/2023) for B/L L3, 4, 5 MBNB , in clinic IV Versed.  Future Appointments  Date Time Provider  Department Center  05/06/2023 10:00 AM Reubin Milan, MD [facility]    Duration of encounter: .  Total time on encounter, as per AMA guidelines included both the face-to-face and non-face-to-face time personally spent by the physician and/or other qualified health care professional(s) on the day of the encounter (includes time in activities that require the physician or other qualified health care professional and does not include time in activities normally performed by clinical staff). Physician's time may include the following activities when performed: Preparing to see the patient (e.g., pre-charting review of records, searching for previously ordered imaging, lab work, and nerve conduction tests) Review of prior analgesic pharmacotherapies. Reviewing PMP Interpreting ordered tests (e.g., lab work, imaging, nerve conduction tests) Performing post-procedure evaluations, including interpretation of diagnostic procedures Obtaining and/or reviewing separately obtained history Performing a medically appropriate examination and/or evaluation Counseling and educating the patient/family/caregiver Ordering medications, tests, or procedures Referring and communicating with other health care professionals (when not separately reported) Documenting clinical information in the electronic or other health record Independently interpreting results (not separately reported) and communicating results to the patient/ family/caregiver Care coordination (not separately reported)  Note by: Edward Jolly, MD (TTS technology used. I apologize for any typographical errors that were not detected and corrected.) Date: 01/12/2023; Time: 12:16 PM

## 2023-01-12 NOTE — Telephone Encounter (Signed)
Returned patient phone call after speaking with BL.  He states that pharmacy can substitute for generic.  I will call patient pharmacy and let them know. They will change to generic.

## 2023-01-12 NOTE — Telephone Encounter (Signed)
PT called back and stated that her insurance will not pay for the Lyrica. PT will like to know if Lateef can send in a prescription for the generic brand. Please give patient a call. TY

## 2023-01-12 NOTE — Progress Notes (Signed)
Safety precautions to be maintained throughout the outpatient stay will include: orient to surroundings, keep bed in low position, maintain call bell within reach at all times, provide assistance with transfer out of bed and ambulation.  

## 2023-01-12 NOTE — Patient Instructions (Addendum)
Procedure instructions  Do not eat or drink fluids (other than water) for 6 hours before your procedure  No water for 2 hours before your procedure  Take your blood pressure medicine with a sip of water  Arrive 30 minutes before your appointment  Carefully read the "Preparing for your procedure" detailed instructions  If you have questions call us at [phone]  _____________________________________________________________________    ______________________________________________________________________  Preparing for your procedure  Appointments: If you think you may not be able to keep your appointment, call 24-48 hours in advance to cancel. We need time to make it available to others.  During your procedure appointment there will be: No Prescription Refills. No disability issues to discussed. No medication changes or discussions.  Instructions: Food intake: Avoid eating anything solid for at least 8 hours prior to your procedure. Clear liquid intake: You may take clear liquids such as water up to 2 hours prior to your procedure. (No carbonated drinks. No soda.) Transportation: Unless otherwise stated by your physician, bring a driver. Morning Medicines: Except for blood thinners, take all of your other morning medications with a sip of water. Make sure to take your heart and blood pressure medicines. If your blood pressure's lower number is above 100, the case will be rescheduled. Blood thinners: Make sure to stop your blood thinners as instructed.  If you take a blood thinner, but were not instructed to stop it, call our office [phone] and ask to talk to a nurse. Not stopping a blood thinner prior to certain procedures could lead to serious complications. Diabetics on insulin: Notify the staff so that you can be scheduled 1st case in the morning. If your diabetes requires high dose insulin, take only  of your normal insulin dose the morning of the procedure and  notify the staff that you have done so. Preventing infections: Shower with an antibacterial soap the morning of your procedure.  Build-up your immune system: Take 1000 mg of Vitamin C with every meal (3 times a day) the day prior to your procedure. Antibiotics: Inform the nursing staff if you are taking any antibiotics or if you have any conditions that may require antibiotics prior to procedures. (Example: recent joint implants)   Pregnancy: If you are pregnant make sure to notify the nursing staff. Not doing so may result in injury to the fetus, including death.  Sickness: If you have a cold, fever, or any active infections, call and cancel or reschedule your procedure. Receiving steroids while having an infection may result in complications. Arrival: You must be in the facility at least 30 minutes prior to your scheduled procedure. Tardiness: Your scheduled time is also the cutoff time. If you do not arrive at least 15 minutes prior to your procedure, you will be rescheduled.  Children: Do not bring any children with you. Make arrangements to keep them home. Dress appropriately: There is always a possibility that your clothing may get soiled. Avoid long dresses. Valuables: Do not bring any jewelry or valuables.  Reasons to call and reschedule or cancel your procedure: (Following these recommendations will minimize the risk of a serious complication.) Surgeries: Avoid having procedures within 2 weeks of any surgery. (Avoid for 2 weeks before or after any surgery). Flu Shots: Avoid having procedures within 2 weeks of a flu shots or . (Avoid for 2 weeks before or after immunizations). Barium: Avoid having a procedure within 7-10 days after having had a radiological study involving the use of radiological contrast. (  Myelograms, Barium swallow or enema study). Heart attacks: Avoid any elective procedures or surgeries for the initial 6 months after a "Myocardial Infarction" (Heart Attack). Blood  thinners: It is imperative that you stop these medications before procedures. Let us know if you if you take any blood thinner.  Infection: Avoid procedures during or within two weeks of an infection (including chest colds or gastrointestinal problems). Symptoms associated with infections include: Localized redness, fever, chills, night sweats or profuse sweating, burning sensation when voiding, cough, congestion, stuffiness, runny nose, sore throat, diarrhea, nausea, vomiting, cold or Flu symptoms, recent or current infections. It is specially important if the infection is over the area that we intend to treat. Heart and lung problems: Symptoms that may suggest an active cardiopulmonary problem include: cough, chest pain, breathing difficulties or shortness of breath, dizziness, ankle swelling, uncontrolled high or unusually low blood pressure, and/or palpitations. If you are experiencing any of these symptoms, cancel your procedure and contact your primary care physician for an evaluation.  Remember:  Regular Business hours are:  Monday to Thursday 8:00 AM to 4:00 PM  Provider's Schedule: Francisco Naveira, MD:  Procedure days: Tuesday and Thursday 7:30 AM to 4:00 PM  Bilal Lateef, MD:  Procedure days: Monday and Wednesday 7:30 AM to 4:00 PM  Facet Blocks Patient Information  Description: The facets are joints in the spine between the vertebrae.  Like any joints in the body, facets can become irritated and painful.  Arthritis can also effect the facets.  By injecting steroids and local anesthetic in and around these joints, we can temporarily block the nerve supply to them.  Steroids act directly on irritated nerves and tissues to reduce selling and inflammation which often leads to decreased pain.  Facet blocks may be done anywhere along the spine from the neck to the low back depending upon the location of your pain.   After numbing the skin with local anesthetic (like Novocaine), a small  needle is passed onto the facet joints under x-ray guidance.  You may experience a sensation of pressure while this is being done.  The entire block usually lasts about 15-25 minutes.   Conditions which may be treated by facet blocks:  Low back/buttock pain Neck/shoulder pain Certain types of headaches  Preparation for the injection:  Do not eat any solid food or dairy products within 8 hours of your appointment. You may drink clear liquid up to 3 hours before appointment.  Clear liquids include water, black coffee, juice or soda.  No milk or cream please. You may take your regular medication, including pain medications, with a sip of water before your appointment.  Diabetics should hold regular insulin (if taken separately) and take 1/2 normal NPH dose the morning of the procedure.  Carry some sugar containing items with you to your appointment. A driver must accompany you and be prepared to drive you home after your procedure. Bring all your current medications with you. An IV may be inserted and sedation may be given at the discretion of the physician. A blood pressure cuff, EKG and other monitors will often be applied during the procedure.  Some patients may need to have extra oxygen administered for a short period. You will be asked to provide medical information, including your allergies and medications, prior to the procedure.  We must know immediately if you are taking blood thinners (like Coumadin/Warfarin) or if you are allergic to IV iodine contrast (dye).  We must know if you could possible   be pregnant.  Possible side-effects:  Bleeding from needle site Infection (rare, may require surgery) Nerve injury (rare) Numbness & tingling (temporary) Difficulty urinating (rare, temporary) Spinal headache (a headache worse with upright posture) Light-headedness (temporary) Pain at injection site (serveral days) Decreased blood pressure (rare, temporary) Weakness in arm/leg  (temporary) Pressure sensation in back/neck (temporary)   Call if you experience:  Fever/chills associated with headache or increased back/neck pain Headache worsened by an upright position New onset, weakness or numbness of an extremity below the injection site Hives or difficulty breathing (go to the emergency room) Inflammation or drainage at the injection site(s) Severe back/neck pain greater than usual New symptoms which are concerning to you  Please note:  Although the local anesthetic injected can often make your back or neck feel good for several hours after the injection, the pain will likely return. It takes 3-7 days for steroids to work.  You may not notice any pain relief for at least one week.  If effective, we will often do a series of 2-3 injections spaced 3-6 weeks apart to maximally decrease your pain.  After the initial series, you may be a candidate for a more permanent nerve block of the facets.  If you have any questions, please call [phone] [facility]    

## 2023-01-15 ENCOUNTER — Other Ambulatory Visit: Payer: Self-pay | Admitting: Internal Medicine

## 2023-01-15 DIAGNOSIS — F5101 Primary insomnia: Secondary | ICD-10-CM

## 2023-01-18 NOTE — Telephone Encounter (Signed)
Requested medications are due for refill today.  yes  Requested medications are on the active medications list.  yes  Last refill. 11/26/2022 #90 0 rf  Future visit scheduled.   yes  Notes to clinic.  Refill not delegated.    Requested Prescriptions  Pending Prescriptions Disp Refills   QUEtiapine (SEROQUEL) 25 MG tablet [Pharmacy Med Name: QUEtiapine Fumarate 25 MG Oral Tablet] 90 tablet 3    Sig: TAKE 1 TABLET BY MOUTH AT  BEDTIME AS NEEDED     Not Delegated - Psychiatry:  Antipsychotics - Second Generation (Atypical) - quetiapine Failed - 01/15/2023 10:57 PM      Failed - This refill cannot be delegated      Failed - Last BP in normal range    BP Readings from Last 1 Encounters:  01/12/23 (!) 136/90         Failed - Lipid Panel in normal range within the last 12 months    Cholesterol, Total  Date Value Ref Range Status  04/28/2022 161 100 - 199 mg/dL Final   LDL Chol Calc (NIH)  Date Value Ref Range Status  04/28/2022 75 0 - 99 mg/dL Final   HDL  Date Value Ref Range Status  04/28/2022 66 >39 mg/dL Final   Triglycerides  Date Value Ref Range Status  04/28/2022 113 0 - 149 mg/dL Final         Passed - TSH in normal range and within 360 days    TSH  Date Value Ref Range Status  04/28/2022 3.020 0.450 - 4.500 uIU/mL Final         Passed - Completed PHQ-2 or PHQ-9 in the last 360 days      Passed - Last Heart Rate in normal range    Pulse Readings from Last 1 Encounters:  12/22/22 76         Passed - Valid encounter within last 6 months    Recent Outpatient Visits           3 weeks ago Essential hypertension   [place] [facility] at [facility], Nyoka Cowden, MD   2 months ago Multilevel lumbosacral spondylosis with radiculopathy   [place] [facility] at [facility], Ocie Bob, MD   4 months ago Multilevel lumbosacral spondylosis with radiculopathy   [place] [facility] at [facility], Ocie Bob, MD   4 months ago Type II diabetes mellitus with complication [facility])   [place] [facility] at [facility], Nyoka Cowden, MD   8 months ago Essential hypertension   [place] [facility] at [facility], Nyoka Cowden, MD       Future Appointments             In 3 months Judithann Graves Nyoka Cowden, MD [facility] at [facility]            Passed - CBC within normal limits and completed in the last 12 months    WBC  Date Value Ref Range Status  04/28/2022 4.3 3.4 - 10.8 x10E3/uL Final   RBC  Date Value Ref Range Status  04/28/2022 4.33 3.77 - 5.28 x10E6/uL Final  12/19/2021 4.42 3.87 - 5.11 Final   Hemoglobin  Date Value Ref Range Status  04/28/2022 12.3 11.1 - 15.9 g/dL Final   Hematocrit  Date Value Ref Range  Status  04/28/2022 38.2 34.0 - 46.6 % Final   MCHC  Date Value Ref Range Status  04/28/2022 32.2 31.5 - 35.7 g/dL Final   [facility]  Date Value Ref Range Status  04/28/2022 28.4 26.6 - 33.0 pg Final   MCV  Date Value Ref Range Status  04/28/2022 88 79 - 97 fL Final   No results found for: "PLTCOUNTKUC", "LABPLAT", "POCPLA" RDW  Date Value Ref Range Status  04/28/2022 12.1 11.7 - 15.4 % Final         Passed - CMP within normal limits and completed in the last 12 months    Albumin  Date Value Ref Range Status  04/28/2022 4.5 3.8 - 4.8 g/dL Final   Alkaline Phosphatase  Date Value Ref Range Status  04/28/2022 59 44 - 121 IU/L Final   ALT  Date Value Ref Range Status  04/28/2022 13 0 - 32 IU/L Final   AST  Date Value Ref Range Status  04/28/2022 17 0 - 40 IU/L Final   BUN  Date Value Ref Range Status  04/28/2022 19 8 - 27 mg/dL Final   Calcium  Date Value Ref Range Status  04/28/2022 9.7 8.7 - 10.3 mg/dL Final   CO2  Date Value Ref Range Status  04/28/2022 22 20 - 29 mmol/L Final   Creatinine, Ser   Date Value Ref Range Status  04/28/2022 0.80 0.57 - 1.00 mg/dL Final   Glucose  Date Value Ref Range Status  04/28/2022 117 (H) 70 - 99 mg/dL Final  16/05/9603 540  Final   Glucose-Capillary  Date Value Ref Range Status  09/07/2022 112 (H) 70 - 99 mg/dL Final    Comment:    Glucose reference range applies only to samples taken after fasting for at least 8 hours.   Potassium  Date Value Ref Range Status  04/28/2022 4.4 3.5 - 5.2 mmol/L Final   Sodium  Date Value Ref Range Status  04/28/2022 144 134 - 144 mmol/L Final   Bilirubin Total  Date Value Ref Range Status  04/28/2022 0.5 0.0 - 1.2 mg/dL Final   Total Protein  Date Value Ref Range Status  04/28/2022 6.1 6.0 - 8.5 g/dL Final   eGFR  Date Value Ref Range Status  04/28/2022 78 >59 mL/min/1.73 Final

## 2023-01-30 ENCOUNTER — Other Ambulatory Visit: Payer: Self-pay | Admitting: Internal Medicine

## 2023-01-30 DIAGNOSIS — F5101 Primary insomnia: Secondary | ICD-10-CM

## 2023-02-01 NOTE — Telephone Encounter (Signed)
Requested medication (s) are due for refill today: routing for review  Requested medication (s) are on the active medication list: yes  Last refill:  11/26/22  Future visit scheduled: yes  Notes to clinic:  Unable to refill per protocol, cannot delegate.      Requested Prescriptions  Pending Prescriptions Disp Refills   QUEtiapine (SEROQUEL) 25 MG tablet [Pharmacy Med Name: QUEtiapine Fumarate 25 MG Oral Tablet] 90 tablet 3    Sig: TAKE 1 TABLET BY MOUTH AT  BEDTIME AS NEEDED     Not Delegated - Psychiatry:  Antipsychotics - Second Generation (Atypical) - quetiapine Failed - 01/30/2023 10:28 PM      Failed - This refill cannot be delegated      Failed - Last BP in normal range    BP Readings from Last 1 Encounters:  01/12/23 (!) 136/90         Failed - Lipid Panel in normal range within the last 12 months    Cholesterol, Total  Date Value Ref Range Status  04/28/2022 161 100 - 199 mg/dL Final   LDL Chol Calc (NIH)  Date Value Ref Range Status  04/28/2022 75 0 - 99 mg/dL Final   HDL  Date Value Ref Range Status  04/28/2022 66 >39 mg/dL Final   Triglycerides  Date Value Ref Range Status  04/28/2022 113 0 - 149 mg/dL Final         Passed - TSH in normal range and within 360 days    TSH  Date Value Ref Range Status  04/28/2022 3.020 0.450 - 4.500 uIU/mL Final         Passed - Completed PHQ-2 or PHQ-9 in the last 360 days      Passed - Last Heart Rate in normal range    Pulse Readings from Last 1 Encounters:  12/22/22 76         Passed - Valid encounter within last 6 months    Recent Outpatient Visits           1 month ago Essential hypertension   [place] [facility] at [facility], Nyoka Cowden, MD   3 months ago Multilevel lumbosacral spondylosis with radiculopathy   [place] [facility] at [facility], Ocie Bob, MD   5 months ago Multilevel lumbosacral spondylosis with radiculopathy    [place] [facility] at [facility], Ocie Bob, MD   5 months ago Type II diabetes mellitus with complication [facility])    Primary Care & Sports Medicine at [facility], Nyoka Cowden, MD   9 months ago Essential hypertension    Primary Care & Sports Medicine at [facility], Nyoka Cowden, MD       Future Appointments             In 3 months Judithann Graves Nyoka Cowden, MD [facility] at [facility]            Passed - CBC within normal limits and completed in the last 12 months    WBC  Date Value Ref Range Status  04/28/2022 4.3 3.4 - 10.8 x10E3/uL Final   RBC  Date Value Ref Range Status  04/28/2022 4.33 3.77 - 5.28 x10E6/uL Final  12/19/2021 4.42 3.87 - 5.11 Final   Hemoglobin  Date Value Ref Range Status  04/28/2022 12.3 11.1 - 15.9 g/dL Final   Hematocrit  Date Value Ref Range Status  04/28/2022 38.2 34.0 - 46.6 % Final   MCHC  Date Value Ref Range Status  04/28/2022 32.2 31.5 - 35.7 g/dL Final   [facility]  Date Value Ref Range Status  04/28/2022 28.4 26.6 - 33.0 pg Final   MCV  Date Value Ref Range Status  04/28/2022 88 79 - 97 fL Final   No results found for: "PLTCOUNTKUC", "LABPLAT", "POCPLA" RDW  Date Value Ref Range Status  04/28/2022 12.1 11.7 - 15.4 % Final         Passed - CMP within normal limits and completed in the last 12 months    Albumin  Date Value Ref Range Status  04/28/2022 4.5 3.8 - 4.8 g/dL Final   Alkaline Phosphatase  Date Value Ref Range Status  04/28/2022 59 44 - 121 IU/L Final   ALT  Date Value Ref Range Status  04/28/2022 13 0 - 32 IU/L Final   AST  Date Value Ref Range Status  04/28/2022 17 0 - 40 IU/L Final   BUN  Date Value Ref Range Status  04/28/2022 19 8 - 27 mg/dL Final   Calcium  Date Value Ref Range Status  04/28/2022 9.7 8.7 - 10.3 mg/dL Final   CO2  Date Value Ref Range Status  04/28/2022 22 20 - 29  mmol/L Final   Creatinine, Ser  Date Value Ref Range Status  04/28/2022 0.80 0.57 - 1.00 mg/dL Final   Glucose  Date Value Ref Range Status  04/28/2022 117 (H) 70 - 99 mg/dL Final  16/05/9603 540  Final   Glucose-Capillary  Date Value Ref Range Status  09/07/2022 112 (H) 70 - 99 mg/dL Final    Comment:    Glucose reference range applies only to samples taken after fasting for at least 8 hours.   Potassium  Date Value Ref Range Status  04/28/2022 4.4 3.5 - 5.2 mmol/L Final   Sodium  Date Value Ref Range Status  04/28/2022 144 134 - 144 mmol/L Final   Bilirubin Total  Date Value Ref Range Status  04/28/2022 0.5 0.0 - 1.2 mg/dL Final   Total Protein  Date Value Ref Range Status  04/28/2022 6.1 6.0 - 8.5 g/dL Final   eGFR  Date Value Ref Range Status  04/28/2022 78 >59 mL/min/1.73 Final

## 2023-02-15 ENCOUNTER — Ambulatory Visit
Payer: Medicare Other | Attending: Student in an Organized Health Care Education/Training Program | Admitting: Student in an Organized Health Care Education/Training Program

## 2023-02-17 ENCOUNTER — Ambulatory Visit
Payer: Medicare Other | Attending: Student in an Organized Health Care Education/Training Program | Admitting: Student in an Organized Health Care Education/Training Program

## 2023-02-17 ENCOUNTER — Ambulatory Visit
Admission: RE | Admit: 2023-02-17 | Discharge: 2023-02-17 | Disposition: A | Discharge status: Home / Self Care | Payer: Medicare Other | Source: Ambulatory Visit | Attending: Student in an Organized Health Care Education/Training Program | Admitting: Student in an Organized Health Care Education/Training Program

## 2023-02-17 ENCOUNTER — Encounter: Payer: Self-pay | Admitting: Student in an Organized Health Care Education/Training Program

## 2023-02-17 DIAGNOSIS — M47816 Spondylosis without myelopathy or radiculopathy, lumbar region: Secondary | ICD-10-CM

## 2023-02-17 MED ORDER — ROPIVACAINE HCL 2 MG/ML IJ SOLN
INTRAMUSCULAR | Status: AC
  Filled 2023-02-17: qty 20

## 2023-02-17 MED ORDER — LIDOCAINE HCL 2 % IJ SOLN
INTRAMUSCULAR | Status: AC
  Filled 2023-02-17: qty 20

## 2023-02-17 MED ORDER — ROPIVACAINE HCL 2 MG/ML IJ SOLN
9.0000 mL | Freq: Once | INTRAMUSCULAR | Status: AC
  Administered 2023-02-17: 9 mL via PERINEURAL

## 2023-02-17 MED ORDER — DEXAMETHASONE SODIUM PHOSPHATE 10 MG/ML IJ SOLN
10.0000 mg | Freq: Once | INTRAMUSCULAR | Status: AC
  Administered 2023-02-17: 10 mg

## 2023-02-17 MED ORDER — MIDAZOLAM HCL 2 MG/2ML IJ SOLN
INTRAMUSCULAR | Status: AC
  Filled 2023-02-17: qty 2

## 2023-02-17 MED ORDER — DEXAMETHASONE SODIUM PHOSPHATE 10 MG/ML IJ SOLN
INTRAMUSCULAR | Status: AC
  Filled 2023-02-17: qty 2

## 2023-02-17 MED ORDER — MIDAZOLAM HCL 2 MG/2ML IJ SOLN
0.5000 mg | Freq: Once | INTRAMUSCULAR | Status: AC
  Administered 2023-02-17: 2 mg via INTRAVENOUS

## 2023-02-17 MED ORDER — ROPIVACAINE HCL 2 MG/ML IJ SOLN: 9.0000 mL | Freq: Once | INTRAMUSCULAR | Status: DC

## 2023-02-17 MED ORDER — LACTATED RINGERS IV SOLN: Freq: Once | INTRAVENOUS | Status: AC

## 2023-02-17 MED ORDER — LIDOCAINE HCL 2 % IJ SOLN
20.0000 mL | Freq: Once | INTRAMUSCULAR | Status: AC
  Administered 2023-02-17: 400 mg

## 2023-02-17 NOTE — Progress Notes (Signed)
PROVIDER NOTE: Interpretation of information contained herein should be left to medically-trained personnel. Specific patient instructions are provided elsewhere under "Patient Instructions" section of medical record. This document was created in part using STT-dictation technology, any transcriptional errors that may result from this process are unintentional.  Patient: Sheri Green Type: Established DOB: [birth year] MRN: [id number] PCP: Reubin Milan, MD  Service: Procedure DOS: 02/17/2023 Setting: Ambulatory Location: Ambulatory outpatient facility Delivery: Face-to-face Provider: Edward Jolly, MD Specialty: Interventional Pain Management Specialty designation: 09 Location: Outpatient facility Ref. Prov.: Edward Jolly, MD       Interventional Therapy   Procedure: Lumbar Facet, Medial Branch Block(s) #1  Laterality: Bilateral  Level: L3, L4, and L5 Medial Branch Level(s). Injecting these levels blocks the L3-4 and L4-5 lumbar facet joints. Imaging: Fluoroscopic guidance         Anesthesia: Local anesthesia (1-2% Lidocaine) Anxiolysis: IV Versed 2 mg DOS: 02/17/2023 Performed by: Edward Jolly, MD  Primary Purpose: Diagnostic/Therapeutic Indications: Low back pain severe enough to impact quality of life or function. 1. Lumbar facet arthropathy   2. Lumbar spondylosis    NAS-11 Pain score:   Pre-procedure: 6 /10   Post-procedure: 4 /10     Position / Prep / Materials:  Position: Prone  Prep solution: DuraPrep (Iodine Povacrylex [0.7% available iodine] and Isopropyl Alcohol, 74% w/w) Area Prepped: Posterolateral Lumbosacral Spine (Wide prep: From the lower border of the scapula down to the end of the tailbone and from flank to flank.)  Materials:  Tray: Block Needle(s):  Type: Spinal  Gauge (G): 22  Length: 3.5-in Qty: 2      H&P (Pre-op Assessment):  Sheri Green is a 77 y.o. (year old), female patient, seen today for interventional treatment. She  has a past  surgical history that includes Abdominal hysterectomy (1994); Total knee arthroplasty (Right, 02/2021); Upper gi endoscopy (2023); Bariatric Surgery (2015); and Esophagogastroduodenoscopy (egd) with propofol (N/A, 09/07/2022). Sheri Green has a current medication list which includes the following prescription(s): atenolol, atorvastatin, bupropion, escitalopram, latanoprost, meloxicam, metformin, pantoprazole, quetiapine, trazodone, UNABLE TO FIND, famotidine, gabapentin, lidocaine, and lyrica, and the following Facility-Administered Medications: lactated ringers and ropivacaine (pf) 2 mg/ml (0.2%). Her primarily concern today is the Back Pain (Lumbar bilateral )  Initial Vital Signs:  Pulse/HCG Rate: 63  Temp: (!) 97.3 F (36.3 C) Resp: 16 BP: (!) 147/84 SpO2: 100 %  BMI: Estimated body mass index is 28.25 kg/m as calculated from the following:   Height as of this encounter: 5\' 6"  (1.676 m).   Weight as of this encounter: 175 lb (79.4 kg).  Risk Assessment: Allergies: Reviewed. She is allergic to diltiazem.  Allergy Precautions: None required Coagulopathies: Reviewed. None identified.  Blood-thinner therapy: None at this time Active Infection(s): Reviewed. None identified. Sheri Green is afebrile  Site Confirmation: Sheri Green was asked to confirm the procedure and laterality before marking the site Procedure checklist: Completed Consent: Before the procedure and under the influence of no sedative(s), amnesic(s), or anxiolytics, the patient was informed of the treatment options, risks and possible complications. To fulfill our ethical and legal obligations, as recommended by the American Medical Association's Code of Ethics, I have informed the patient of my clinical impression; the nature and purpose of the treatment or procedure; the risks, benefits, and possible complications of the intervention; the alternatives, including doing nothing; the risk(s) and benefit(s) of the alternative  treatment(s) or procedure(s); and the risk(s) and benefit(s) of doing nothing. The patient was provided information about the general  risks and possible complications associated with the procedure. These may include, but are not limited to: failure to achieve desired goals, infection, bleeding, organ or nerve damage, allergic reactions, paralysis, and death. In addition, the patient was informed of those risks and complications associated to Spine-related procedures, such as failure to decrease pain; infection (i.e.: Meningitis, epidural or intraspinal abscess); bleeding (i.e.: epidural hematoma, subarachnoid hemorrhage, or any other type of intraspinal or peri-dural bleeding); organ or nerve damage (i.e.: Any type of peripheral nerve, nerve root, or spinal cord injury) with subsequent damage to sensory, motor, and/or autonomic systems, resulting in permanent pain, numbness, and/or weakness of one or several areas of the body; allergic reactions; (i.e.: anaphylactic reaction); and/or death. Furthermore, the patient was informed of those risks and complications associated with the medications. These include, but are not limited to: allergic reactions (i.e.: anaphylactic or anaphylactoid reaction(s)); adrenal axis suppression; blood sugar elevation that in diabetics may result in ketoacidosis or comma; water retention that in patients with history of congestive heart failure may result in shortness of breath, pulmonary edema, and decompensation with resultant heart failure; weight gain; swelling or edema; medication-induced neural toxicity; particulate matter embolism and blood vessel occlusion with resultant organ, and/or nervous system infarction; and/or aseptic necrosis of one or more joints. Finally, the patient was informed that Medicine is not an exact science; therefore, there is also the possibility of unforeseen or unpredictable risks and/or possible complications that may result in a catastrophic  outcome. The patient indicated having understood very clearly. We have given the patient no guarantees and we have made no promises. Enough time was given to the patient to ask questions, all of which were answered to the patient's satisfaction. Ms. Tejada has indicated that she wanted to continue with the procedure. Attestation: I, the ordering provider, attest that I have discussed with the patient the benefits, risks, side-effects, alternatives, likelihood of achieving goals, and potential problems during recovery for the procedure that I have provided informed consent. Date  Time: 02/17/2023  9:14 AM   Pre-Procedure Preparation:  Monitoring: As per clinic protocol. Respiration, ETCO2, SpO2, BP, heart rate and rhythm monitor placed and checked for adequate function Safety Precautions: Patient was assessed for positional comfort and pressure points before starting the procedure. Time-out: I initiated and conducted the "Time-out" before starting the procedure, as per protocol. The patient was asked to participate by confirming the accuracy of the "Time Out" information. Verification of the correct person, site, and procedure were performed and confirmed by me, the nursing staff, and the patient. "Time-out" conducted as per Joint Commission's Universal Protocol (UP.01.01.01). Time: 0948 Start Time: 0948 hrs.  Description of Procedure:          Laterality: (see above) Targeted Levels: (see above)  Safety Precautions: Aspiration looking for blood return was conducted prior to all injections. At no point did we inject any substances, as a needle was being advanced. Before injecting, the patient was told to immediately notify me if she was experiencing any new onset of "ringing in the ears, or metallic taste in the mouth". No attempts were made at seeking any paresthesias. Safe injection practices and needle disposal techniques used. Medications properly checked for expiration dates. SDV (single dose  vial) medications used. After the completion of the procedure, all disposable equipment used was discarded in the proper designated medical waste containers. Local Anesthesia: Protocol guidelines were followed. The patient was positioned over the fluoroscopy table. The area was prepped in the usual manner. The time-out  was completed. The target area was identified using fluoroscopy. A 12-in long, straight, sterile hemostat was used with fluoroscopic guidance to locate the targets for each level blocked. Once located, the skin was marked with an approved surgical skin marker. Once all sites were marked, the skin (epidermis, dermis, and hypodermis), as well as deeper tissues (fat, connective tissue and muscle) were infiltrated with a small amount of a short-acting local anesthetic, loaded on a 10cc syringe with a 25G, 1.5-in  Needle. An appropriate amount of time was allowed for local anesthetics to take effect before proceeding to the next step. Local Anesthetic: Lidocaine 2.0% The unused portion of the local anesthetic was discarded in the proper designated containers. Technical description of process:   L3 Medial Branch Nerve Block (MBB): The target area for the L3 medial branch is at the junction of the postero-lateral aspect of the superior articular process and the superior, posterior, and medial edge of the transverse process of L4. Under fluoroscopic guidance, a Quincke needle was inserted until contact was made with os over the superior postero-lateral aspect of the pedicular shadow (target area). After negative aspiration for blood, 2mL of the nerve block solution was injected without difficulty or complication. The needle was removed intact. L4 Medial Branch Nerve Block (MBB): The target area for the L4 medial branch is at the junction of the postero-lateral aspect of the superior articular process and the superior, posterior, and medial edge of the transverse process of L5. Under fluoroscopic  guidance, a Quincke needle was inserted until contact was made with os over the superior postero-lateral aspect of the pedicular shadow (target area). After negative aspiration for blood, 2 mL of the nerve block solution was injected without difficulty or complication. The needle was removed intact. L5 Medial Branch Nerve Block (MBB): The target area for the L5 medial branch is at the junction of the postero-lateral aspect of the superior articular process and the superior, posterior, and medial edge of the sacral ala. Under fluoroscopic guidance, a Quincke needle was inserted until contact was made with os over the superior postero-lateral aspect of the pedicular shadow (target area). After negative aspiration for blood, 2mL of the nerve block solution was injected without difficulty or complication. The needle was removed intact.  Once the entire procedure was completed, the treated area was cleaned, making sure to leave some of the prepping solution back to take advantage of its long term bactericidal properties.         Illustration of the posterior view of the lumbar spine and the posterior neural structures. Laminae of L2 through S1 are labeled. DPRL5, dorsal primary ramus of L5; DPRS1, dorsal primary ramus of S1; DPR3, dorsal primary ramus of L3; FJ, facet (zygapophyseal) joint L3-L4; I, inferior articular process of L4; LB1, lateral branch of dorsal primary ramus of L1; IAB, inferior articular branches from L3 medial branch (supplies L4-L5 facet joint); IBP, intermediate branch plexus; MB3, medial branch of dorsal primary ramus of L3; NR3, third lumbar nerve root; S, superior articular process of L5; SAB, superior articular branches from L4 (supplies L4-5 facet joint also); TP3, transverse process of L3.   Facet Joint Innervation (* possible contribution)  L1-2 T12, L1 (L2*)  Medial Branch  L2-3 L1, L2 (L3*)         "          "  L3-4 L2, L3 (L4*)         "          "  L4-5 L3, L4 (L5*)          "          "  L5-S1 L4, L5, S1          "          "    Vitals:   02/17/23 0947 02/17/23 0952 02/17/23 0955 02/17/23 1000  BP: 126/75 128/72 132/71 (!) 127/55  Pulse: 62 61 61 64  Resp: 16 20 18 16   Temp:      TempSrc:      SpO2: 99% 99%  98%  Weight:      Height:         End Time: 0954 hrs.  Imaging Guidance (Spinal):          Type of Imaging Technique: Fluoroscopy Guidance (Spinal) Indication(s): Assistance in needle guidance and placement for procedures requiring needle placement in or near specific anatomical locations not easily accessible without such assistance. Exposure Time: Please see nurses notes. Contrast: None used. Fluoroscopic Guidance: I was personally present during the use of fluoroscopy. "Tunnel Vision Technique" used to obtain the best possible view of the target area. Parallax error corrected before commencing the procedure. "Direction-depth-direction" technique used to introduce the needle under continuous pulsed fluoroscopy. Once target was reached, antero-posterior, oblique, and lateral fluoroscopic projection used confirm needle placement in all planes. Images permanently stored in EMR. Interpretation: No contrast injected. I personally interpreted the imaging intraoperatively. Adequate needle placement confirmed in multiple planes. Permanent images saved into the patient's record.  Post-operative Assessment:  Post-procedure Vital Signs:  Pulse/HCG Rate: 64  Temp: (!) 97.3 F (36.3 C) Resp: 16 BP: (!) 127/55 SpO2: 98 %  EBL: None  Complications: No immediate post-treatment complications observed by team, or reported by patient.  Note: The patient tolerated the entire procedure well. A repeat set of vitals were taken after the procedure and the patient was kept under observation following institutional policy, for this type of procedure. Post-procedural neurological assessment was performed, showing return to baseline, prior to discharge. The patient  was provided with post-procedure discharge instructions, including a section on how to identify potential problems. Should any problems arise concerning this procedure, the patient was given instructions to immediately contact us, at any time, without hesitation. In any case, we plan to contact the patient by telephone for a follow-up status report regarding this interventional procedure.  Comments:  No additional relevant information.  Plan of Care (POC)  Orders:  Orders Placed This Encounter  Procedures   DG PAIN CLIN[facility] C-ARM 1-60 MIN NO REPORT    Intraoperative interpretation by procedural physician at [facility].    Standing Status:   Standing    Number of Occurrences:   1    Order Specific Question:   Reason for exam:    Answer:   Assistance in needle guidance and placement for procedures requiring needle placement in or near specific anatomical locations not easily accessible without such assistance.     Medications ordered for procedure: Meds ordered this encounter  Medications   lidocaine (XYLOCAINE) 2 % (with pres) injection 400 mg   lactated ringers infusion   midazolam (VERSED) injection 0.5-2 mg    Make sure Flumazenil is available in the pyxis when using this medication. If oversedation occurs, administer 0.2 mg IV over 15 sec. If after 45 sec no response, administer 0.2 mg again over 1 min; may repeat at 1 min intervals; not to exceed 4 doses (1 mg)   dexamethasone (  DECADRON) injection 10 mg   dexamethasone (DECADRON) injection 10 mg   ropivacaine (PF) 2 mg/mL (0.2%) (NAROPIN) injection 9 mL   ropivacaine (PF) 2 mg/mL (0.2%) (NAROPIN) injection 9 mL   Medications administered: We administered lidocaine, lactated ringers, midazolam, dexamethasone, dexamethasone, and ropivacaine (PF) 2 mg/mL (0.2%).  See the medical record for exact dosing, route, and time of administration.  Follow-up plan:   Return in about 4 weeks (around 03/17/2023) for PPE F2F.       Recent Visits Date Type Provider Dept  01/12/23 Office Visit Edward Jolly, MD [facility]  Showing recent visits within past 90 days and meeting all other requirements Today's Visits Date Type Provider Dept  02/17/23 Procedure visit Edward Jolly, MD [facility]  Showing today's visits and meeting all other requirements Future Appointments Date Type Provider Dept  03/17/23 Appointment Edward Jolly, MD [facility]  Showing future appointments within next 90 days and meeting all other requirements  Disposition: Discharge home  Discharge (Date  Time): 02/17/2023; 1011 hrs.   Primary Care Physician: Reubin Milan, MD Location: [facility] Note by: Edward Jolly, MD (TTS technology used. I apologize for any typographical errors that were not detected and corrected.) Date: 02/17/2023; Time: 10:32 AM  Disclaimer:  Medicine is not an Visual merchandiser. The only guarantee in medicine is that nothing is guaranteed. It is important to note that the decision to proceed with this intervention was based on the information collected from the patient. The Data and conclusions were drawn from the patient's questionnaire, the interview, and the physical examination. Because the information was provided in large part by the patient, it cannot be guaranteed that it has not been purposely or unconsciously manipulated. Every effort has been made to obtain as much relevant data as possible for this evaluation. It is important to note that the conclusions that lead to this procedure are derived in large part from the available data. Always take into account that the treatment will also be dependent on availability of resources and existing treatment guidelines, considered by other Pain Management Practitioners as being common knowledge and practice, at the time of the intervention. For Medico-Legal purposes, it is also important to point out that variation  in procedural techniques and pharmacological choices are the acceptable norm. The indications, contraindications, technique, and results of the above procedure should only be interpreted and judged by a Board-Certified Interventional Pain Specialist with extensive familiarity and expertise in the same exact procedure and technique.

## 2023-02-17 NOTE — Progress Notes (Signed)
Safety precautions to be maintained throughout the outpatient stay will include: orient to surroundings, keep bed in low position, maintain call bell within reach at all times, provide assistance with transfer out of bed and ambulation.  

## 2023-02-17 NOTE — Patient Instructions (Signed)

## 2023-02-23 ENCOUNTER — Other Ambulatory Visit: Payer: Self-pay | Admitting: Internal Medicine

## 2023-02-23 DIAGNOSIS — F5101 Primary insomnia: Secondary | ICD-10-CM

## 2023-03-17 ENCOUNTER — Ambulatory Visit
Payer: Medicare Other | Attending: Student in an Organized Health Care Education/Training Program | Admitting: Student in an Organized Health Care Education/Training Program

## 2023-03-17 ENCOUNTER — Encounter: Payer: Self-pay | Admitting: Student in an Organized Health Care Education/Training Program

## 2023-03-17 VITALS — BP 107/60 | HR 74 | Temp 97.5°F | Ht 67.0 in | Wt 175.0 lb

## 2023-03-17 DIAGNOSIS — M48062 Spinal stenosis, lumbar region with neurogenic claudication: Secondary | ICD-10-CM | POA: Diagnosis not present

## 2023-03-17 DIAGNOSIS — M47816 Spondylosis without myelopathy or radiculopathy, lumbar region: Secondary | ICD-10-CM | POA: Diagnosis not present

## 2023-03-17 DIAGNOSIS — M5136 Other intervertebral disc degeneration, lumbar region: Secondary | ICD-10-CM | POA: Insufficient documentation

## 2023-03-17 NOTE — Progress Notes (Signed)
PROVIDER NOTE: Information contained herein reflects review and annotations entered in association with encounter. Interpretation of such information and data should be left to medically-trained personnel. Information provided to patient can be located elsewhere in the medical record under "Patient Instructions". Document created using STT-dictation technology, any transcriptional errors that may result from process are unintentional.    Patient: Eleonore Chiquito  Service Category: E/M  Provider: Edward Jolly, MD  DOB: August 26, 1945  DOS: 03/17/2023  Referring Provider: Reubin Milan, MD  MRN: [id number]  Specialty: Interventional Pain Management  PCP: Reubin Milan, MD  Type: Established Patient  Setting: Ambulatory outpatient    Location: Office  Delivery: Face-to-face     HPI  Ms. CHEVELLA RUBI, a 77 y.o. year old female, is here today because of her Lumbar facet arthropathy [M47.816]. Ms. Aikins primary complain today is Back Pain  Pertinent problems: Ms. Windecker does not have any pertinent problems on file. Pain Assessment: Severity of   is reported as a 1 /10. Location: Back Right, Left/Denies. Onset: More than a month ago. Quality: Aching. Timing: Intermittent. Modifying factor(s): procedure, lidocaine patches, laying down. Vitals:  height is 5\' 7"  (1.702 m) and weight is 175 lb (79.4 kg). Her temperature is 97.5 F (36.4 C) (abnormal). Her blood pressure is 107/60 and her pulse is 74. Her oxygen saturation is 99%.  BMI: Estimated body mass index is 27.41 kg/m as calculated from the following:   Height as of this encounter: 5\' 7"  (1.702 m).   Weight as of this encounter: 175 lb (79.4 kg). Last encounter: 01/12/2023. Last procedure: 02/17/2023.  Reason for encounter: post-procedure evaluation and assessment.    Post-procedure evaluation   Procedure: Lumbar Facet, Medial Branch Block(s) #1  Laterality: Bilateral  Level: L3, L4, and L5 Medial Branch Level(s). Injecting these levels  blocks the L3-4 and L4-5 lumbar facet joints. Imaging: Fluoroscopic guidance         Anesthesia: Local anesthesia (1-2% Lidocaine) Anxiolysis: IV Versed 2 mg DOS: 02/17/2023 Performed by: Edward Jolly, MD  Primary Purpose: Diagnostic/Therapeutic Indications: Low back pain severe enough to impact quality of life or function. 1. Lumbar facet arthropathy   2. Lumbar spondylosis    NAS-11 Pain score:   Pre-procedure: 6 /10   Post-procedure: 4 /10      Effectiveness:  Initial hour after procedure: 100 %  Subsequent 4-6 hours post-procedure: 100 %  Analgesia past initial 6 hours: 100 %  Ongoing improvement:  Analgesic:  100% Function: Ms. Opitz reports improvement in function ROM: Ms. Willenbrink reports improvement in ROM    ROS  Constitutional: Denies any fever or chills Gastrointestinal: No reported hemesis, hematochezia, vomiting, or acute GI distress Musculoskeletal: Denies any acute onset joint swelling, redness, loss of ROM, or weakness Neurological: No reported episodes of acute onset apraxia, aphasia, dysarthria, agnosia, amnesia, paralysis, loss of coordination, or loss of consciousness  Medication Review  QUEtiapine, UNABLE TO FIND, atenolol, atorvastatin, buPROPion, escitalopram, latanoprost, meloxicam, metFORMIN, pantoprazole, pregabalin, and traZODone  History Review  Allergy: Ms. Benton is allergic to diltiazem. Drug: Ms. Garczynski  reports no history of drug use. Alcohol:  reports current alcohol use. Tobacco:  reports that she has never smoked. She has never used smokeless tobacco. Social: Ms. Steininger  reports that she has never smoked. She has never used smokeless tobacco. She reports current alcohol use. She reports that she does not use drugs. Medical:  has a past medical history of Barrett's esophagus determined by endoscopy, Diabetes mellitus without complication (  HCC), Glaucoma, Hypertension, Sciatica of left side, and Urinary incontinence. Surgical: Ms. Lecount   has a past surgical history that includes Abdominal hysterectomy (1994); Total knee arthroplasty (Right, 02/2021); Upper gi endoscopy (2023); Bariatric Surgery (2015); and Esophagogastroduodenoscopy (egd) with propofol (N/A, 09/07/2022). Family: family history includes Bladder Cancer in her mother; Glaucoma in her father; Heart attack in her father; Heart disease in her father; Hyperlipidemia in her sister; Hypertension in her brother and father.  Laboratory Chemistry Profile   Renal Lab Results  Component Value Date   BUN 19 04/28/2022   CREATININE 0.80 04/28/2022   BCR 24 04/28/2022    Hepatic Lab Results  Component Value Date   AST 17 04/28/2022   ALT 13 04/28/2022   ALBUMIN 4.5 04/28/2022   ALKPHOS 59 04/28/2022    Electrolytes Lab Results  Component Value Date   NA 144 04/28/2022   K 4.4 04/28/2022   CL 104 04/28/2022   CALCIUM 9.7 04/28/2022    Bone No results found for: "VD25OH", "VD125OH2TOT", "ZO1096EA5", "WU9811BJ4", "25OHVITD1", "25OHVITD2", "25OHVITD3", "TESTOFREE", "TESTOSTERONE"  Inflammation (CRP: Acute Phase) (ESR: Chronic Phase) No results found for: "CRP", "ESRSEDRATE", "LATICACIDVEN"       Note: Above Lab results reviewed.  Recent Imaging Review  DG PAIN CLINIC C-ARM 1-60 MIN NO REPORT Fluoro was used, but no Radiologist interpretation will be provided.  Please refer to "NOTES" tab for provider progress note. Note: Reviewed        Physical Exam  General appearance: Well nourished, well developed, and well hydrated. In no apparent acute distress Mental status: Alert, oriented x 3 (person, place, & time)       Respiratory: No evidence of acute respiratory distress Eyes: PERLA Vitals: BP 107/60   Pulse 74   Temp (!) 97.5 F (36.4 C)   Ht 5\' 7"  (1.702 m)   Wt 175 lb (79.4 kg)   SpO2 99%   BMI 27.41 kg/m  BMI: Estimated body mass index is 27.41 kg/m as calculated from the following:   Height as of this encounter: 5\' 7"  (1.702 m).   Weight as of  this encounter: 175 lb (79.4 kg). Ideal: Ideal body weight: 61.6 kg (135 lb 12.9 oz) Adjusted ideal body weight: 68.7 kg (151 lb 7.7 oz)  Assessment   Diagnosis Status  1. Lumbar facet arthropathy   2. Lumbar spondylosis   3. Lumbar degenerative disc disease   4. Spinal stenosis, lumbar region, with neurogenic claudication    Controlled Controlled Controlled    Orders:  Orders Placed This Encounter  Procedures   LUMBAR FACET(MEDIAL BRANCH NERVE BLOCK) MBNB    Standing Status:   Standing    Number of Occurrences:   4    Standing Expiration Date:   03/16/2024    Scheduling Instructions:     Procedure: Lumbar facet block (AKA.: Lumbosacral medial branch nerve block)     Side: Bilateral     Level:  L3-4, L4-5, Facets (L3, L4, L5, aedial Branch)     Sedation: Patient's choice.     Timeframe: PRN    Order Specific Question:   Where will this procedure be performed?    Answer:   [facility]   Follow-up plan:   Return for PRN- Lumbar MBNB #2, please call to schedule when needed.      Recent Visits Date Type Provider Dept  02/17/23 Procedure visit Edward Jolly, MD [facility]  01/12/23 Office Visit Edward Jolly, MD [facility]  Showing recent visits within  past 90 days and meeting all other requirements Today's Visits Date Type Provider Dept  03/17/23 Office Visit Edward Jolly, MD [facility]  Showing today's visits and meeting all other requirements Future Appointments No visits were found meeting these conditions. Showing future appointments within next 90 days and meeting all other requirements  I discussed the assessment and treatment plan with the patient. The patient was provided an opportunity to ask questions and all were answered. The patient agreed with the plan and demonstrated an understanding of the instructions.  Patient advised to call back or seek an in-person evaluation if the symptoms or condition  worsens.  Duration of encounter: 15 minutes.  Total time on encounter, as per AMA guidelines included both the face-to-face and non-face-to-face time personally spent by the physician and/or other qualified health care professional(s) on the day of the encounter (includes time in activities that require the physician or other qualified health care professional and does not include time in activities normally performed by clinical staff). Physician's time may include the following activities when performed: Preparing to see the patient (e.g., pre-charting review of records, searching for previously ordered imaging, lab work, and nerve conduction tests) Review of prior analgesic pharmacotherapies. Reviewing PMP Interpreting ordered tests (e.g., lab work, imaging, nerve conduction tests) Performing post-procedure evaluations, including interpretation of diagnostic procedures Obtaining and/or reviewing separately obtained history Performing a medically appropriate examination and/or evaluation Counseling and educating the patient/family/caregiver Ordering medications, tests, or procedures Referring and communicating with other health care professionals (when not separately reported) Documenting clinical information in the electronic or other health record Independently interpreting results (not separately reported) and communicating results to the patient/ family/caregiver Care coordination (not separately reported)  Note by: Edward Jolly, MD Date: 03/17/2023; Time: 2:54 PM

## 2023-03-17 NOTE — Patient Instructions (Signed)
When your pain levels return and you're experiencing less than 50% pain relief (from your last block), that is an indication for your to return and repeat nerve blocks, set #2. After that we can consider a lumbar radiofrequency ablation

## 2023-04-19 ENCOUNTER — Other Ambulatory Visit: Payer: Self-pay | Admitting: Internal Medicine

## 2023-04-19 DIAGNOSIS — F5101 Primary insomnia: Secondary | ICD-10-CM

## 2023-04-26 ENCOUNTER — Other Ambulatory Visit: Payer: Self-pay | Admitting: Internal Medicine

## 2023-05-06 ENCOUNTER — Encounter: Payer: Self-pay | Admitting: Internal Medicine

## 2023-05-06 ENCOUNTER — Ambulatory Visit (INDEPENDENT_AMBULATORY_CARE_PROVIDER_SITE_OTHER): Payer: Medicare Other | Admitting: Internal Medicine

## 2023-05-06 VITALS — BP 102/62 | HR 70 | Ht 67.0 in | Wt 183.2 lb

## 2023-05-06 DIAGNOSIS — E118 Type 2 diabetes mellitus with unspecified complications: Secondary | ICD-10-CM

## 2023-05-06 DIAGNOSIS — Z7984 Long term (current) use of oral hypoglycemic drugs: Secondary | ICD-10-CM | POA: Diagnosis not present

## 2023-05-06 DIAGNOSIS — F324 Major depressive disorder, single episode, in partial remission: Secondary | ICD-10-CM

## 2023-05-06 DIAGNOSIS — Z23 Encounter for immunization: Secondary | ICD-10-CM

## 2023-05-06 DIAGNOSIS — M48062 Spinal stenosis, lumbar region with neurogenic claudication: Secondary | ICD-10-CM

## 2023-05-06 DIAGNOSIS — E1169 Type 2 diabetes mellitus with other specified complication: Secondary | ICD-10-CM

## 2023-05-06 DIAGNOSIS — E785 Hyperlipidemia, unspecified: Secondary | ICD-10-CM

## 2023-05-06 DIAGNOSIS — I10 Essential (primary) hypertension: Secondary | ICD-10-CM

## 2023-05-06 NOTE — Assessment & Plan Note (Signed)
LDL is  Lab Results  Component Value Date   LDLCALC 75 04/28/2022   Currently being treated with atorvastatin with good compliance and no concerns.

## 2023-05-06 NOTE — Addendum Note (Signed)
Addended by: Reubin Milan on: 05/06/2023 10:23 AM   Modules accepted: Orders

## 2023-05-06 NOTE — Assessment & Plan Note (Signed)
Clinically stable on current regimen with good control of symptoms, No SI or HI. Will continue Lexapro, Trazodone, Seroquel and Bupropion.

## 2023-05-06 NOTE — Assessment & Plan Note (Signed)
Normal exam with stable BP on atenolol. No concerns or side effects to current medication. No change in regimen; continue low sodium diet.

## 2023-05-06 NOTE — Assessment & Plan Note (Addendum)
Blood sugars stable without hypoglycemic symptoms or events. Current regimen is metformin. Changes made last visit are none.  She continues to gain weight and would like to try Ozempic if needed. Lab Results  Component Value Date   HGBA1C 6.5 (A) 12/22/2022

## 2023-05-06 NOTE — Progress Notes (Addendum)
Date:  05/06/2023   Name:  Sheri Green   DOB:  1946/01/21   MRN:  [id number]   Chief Complaint: Hypertension, Diabetes, and Hyperlipidemia  Hypertension This is a chronic problem. The problem is controlled. Pertinent negatives include no chest pain, headaches, palpitations or shortness of breath. Past treatments include beta blockers.  Diabetes She presents for her follow-up diabetic visit. She has type 2 diabetes mellitus. Pertinent negatives for hypoglycemia include no headaches, nervousness/anxiousness or tremors. Pertinent negatives for diabetes include no chest pain, no fatigue, no polydipsia and no polyuria. Current diabetic treatment includes oral agent (dual therapy).  Hyperlipidemia This is a chronic problem. The problem is controlled. Pertinent negatives include no chest pain or shortness of breath. Current antihyperlipidemic treatment includes statins. The current treatment provides significant improvement of lipids.    Lab Results  Component Value Date   NA 144 04/28/2022   K 4.4 04/28/2022   CO2 22 04/28/2022   GLUCOSE 117 (H) 04/28/2022   BUN 19 04/28/2022   CREATININE 0.80 04/28/2022   CALCIUM 9.7 04/28/2022   EGFR 78 04/28/2022   Lab Results  Component Value Date   CHOL 161 04/28/2022   HDL 66 04/28/2022   LDLCALC 75 04/28/2022   TRIG 113 04/28/2022   CHOLHDL 2.4 04/28/2022   Lab Results  Component Value Date   TSH 3.020 04/28/2022   Lab Results  Component Value Date   HGBA1C 6.5 (A) 12/22/2022   Lab Results  Component Value Date   WBC 4.3 04/28/2022   HGB 12.3 04/28/2022   HCT 38.2 04/28/2022   MCV 88 04/28/2022   PLT 227 04/28/2022   Lab Results  Component Value Date   ALT 13 04/28/2022   AST 17 04/28/2022   ALKPHOS 59 04/28/2022   BILITOT 0.5 04/28/2022   No results found for: "25OHVITD2", "25OHVITD3", "VD25OH"   Review of Systems  Constitutional:  Positive for unexpected weight change. Negative for appetite change, diaphoresis,  fatigue and fever.  HENT:  Negative for tinnitus and trouble swallowing.   Eyes:  Negative for visual disturbance.  Respiratory:  Negative for cough, chest tightness and shortness of breath.   Cardiovascular:  Negative for chest pain, palpitations and leg swelling.  Gastrointestinal:  Negative for abdominal pain.  Endocrine: Negative for polydipsia and polyuria.  Genitourinary:  Negative for dysuria and hematuria.  Musculoskeletal:  Negative for arthralgias.  Neurological:  Negative for tremors, numbness and headaches.  Psychiatric/Behavioral:  Negative for dysphoric mood and sleep disturbance. The patient is not nervous/anxious.     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   Spinal stenosis, lumbar region, 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   Hyperlipidemia associated with type 2 diabetes mellitus (HCC) 08/17/1998   Essential hypertension 08/17/1988   Insomnia 08/17/1986    Allergies  Allergen Reactions   Diltiazem Hives    Past Surgical History:  Procedure Laterality Date   ABDOMINAL HYSTERECTOMY  1994  BARIATRIC SURGERY  2015   gastric sleeve   ESOPHAGOGASTRODUODENOSCOPY (EGD) WITH PROPOFOL N/A 09/07/2022   Procedure: ESOPHAGOGASTRODUODENOSCOPY (EGD) WITH PROPOFOL;  Surgeon: Midge Minium, MD;  Location: [facility];  Service: Endoscopy;  Laterality: N/A;    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   UPPER GI ENDOSCOPY  2023    Social History   Tobacco Use   Smoking status: Never   Smokeless tobacco: Never  Substance Use Topics   Alcohol use: Yes    Comment: rare   Drug use: No     Medication list has been reviewed and updated.  Current Meds  Medication Sig   atenolol (TENORMIN) 25 MG tablet TAKE 1 TABLET BY MOUTH DAILY   atorvastatin (LIPITOR) 40 MG tablet Take 1 tablet (40 mg total) by mouth daily.   buPROPion (WELLBUTRIN XL) 300 MG 24 hr tablet TAKE 1 TABLET BY MOUTH DAILY   escitalopram (LEXAPRO) 10 MG tablet Take 1 tablet (10 mg total) by mouth daily.   latanoprost (XALATAN) 0.005 % ophthalmic solution 1 drop at bedtime.   LYRICA 25 MG capsule Take 1 capsule (25 mg total) by mouth at bedtime for 7 days, THEN 1 capsule (25 mg total) 2 (two) times daily for 23 days.   meloxicam (MOBIC) 7.5 MG tablet Take 1 tablet (7.5 mg total) by mouth daily as needed for pain. Take with food.   metFORMIN (GLUCOPHAGE) 500 MG tablet TAKE 1 TABLET BY MOUTH TWICE  DAILY WITH MEALS   pantoprazole (PROTONIX) 20 MG tablet Take 20 mg by mouth daily.   QUEtiapine (SEROQUEL) 25 MG tablet TAKE 1 TABLET BY MOUTH AT  BEDTIME AS NEEDED   traZODone (DESYREL) 50 MG tablet Take 50 mg by mouth at bedtime.   UNABLE TO FIND Med Name: Nature's [employer] Kanaris and Fruit Supplement       05/06/2023    9:59 AM 12/22/2022    3:12 PM 08/25/2022    2:29 PM 04/27/2022    2:52 PM  GAD 7 : Generalized Anxiety Score  Nervous, Anxious, on Edge 1 0 1 2  Control/stop worrying 1 0 2 2  Worry too much - different things 1 0 2 1  Trouble relaxing 1 3 0 3  Restless 1 0 0 1  Easily annoyed or irritable 0 0 0 1  Afraid - awful might happen 0 0 0 0  Total GAD 7 Score 5 3 5 10   Anxiety Difficulty Somewhat difficult Not difficult at all Not difficult at all Somewhat difficult       05/06/2023    9:59 AM 01/12/2023   11:23 AM 12/22/2022    3:12 PM  Depression screen PHQ 2/9   Decreased Interest 1 0 1  Down, Depressed, Hopeless 1 0 1  PHQ - 2 Score 2 0 2  Altered sleeping 0  1  Tired, decreased energy 3  2  Change in appetite 1  3  Feeling bad or failure about yourself  0  0  Trouble concentrating 0  0  Moving slowly or fidgety/restless 0  2  Suicidal thoughts 0  0  PHQ-9 Score 6  10  Difficult doing work/chores Not difficult at all  Not difficult at all    BP Readings from Last 3 Encounters:  05/06/23 102/62  03/17/23 107/60  02/17/23 (!) 127/55    Physical Exam Vitals and nursing note reviewed.  Constitutional:      General: She is not in acute distress.    Appearance: She is  well-developed.  HENT:     Head: Normocephalic and atraumatic.     Right Ear: Tympanic membrane normal.     Left Ear: Tympanic membrane normal.  Neck:     Vascular: No carotid bruit.  Cardiovascular:     Rate and Rhythm: Normal rate and regular rhythm.     Pulses: Normal pulses.     Heart sounds: No murmur heard. Pulmonary:     Effort: Pulmonary effort is normal. No respiratory distress.     Breath sounds: No wheezing or rhonchi.  Musculoskeletal:     Cervical back: Normal range of motion.     Right lower leg: No edema.     Left lower leg: No edema.  Lymphadenopathy:     Cervical: No cervical adenopathy.  Skin:    General: Skin is warm and dry.     Capillary Refill: Capillary refill takes less than 2 seconds.     Findings: No rash.  Neurological:     General: No focal deficit present.     Mental Status: She is alert and oriented to person, place, and time.  Psychiatric:        Mood and Affect: Mood normal.        Behavior: Behavior normal.    Diabetic Foot Exam - Simple   Simple Foot Form Diabetic Foot exam was performed with the following findings: Yes 05/06/2023 10:11 AM  Visual Inspection No deformities, no ulcerations, no other skin breakdown bilaterally: Yes Sensation Testing Intact to touch and monofilament testing bilaterally: Yes Pulse  Check Posterior Tibialis and Dorsalis pulse intact bilaterally: Yes Comments      Wt Readings from Last 3 Encounters:  05/06/23 183 lb 3.2 oz (83.1 kg)  03/17/23 175 lb (79.4 kg)  02/17/23 175 lb (79.4 kg)    BP 102/62   Pulse 70   Ht 5\' 7"  (1.702 m)   Wt 183 lb 3.2 oz (83.1 kg)   SpO2 97%   BMI 28.69 kg/m   Assessment and Plan:  Problem List Items Addressed This Visit       Unprioritized   Type II diabetes mellitus with complication (HCC) (Chronic)    Blood sugars stable without hypoglycemic symptoms or events. Current regimen is metformin. Changes made last visit are none.  She continues to gain weight and would like to try Ozempic if needed. Lab Results  Component Value Date   HGBA1C 6.5 (A) 12/22/2022         Relevant Orders   Comprehensive metabolic panel   Hemoglobin A1c   Microalbumin / creatinine urine ratio   Ambulatory referral to Podiatry   Spinal stenosis, lumbar region, with neurogenic claudication    She is doing well on Lyrica prescribed by pain management.      Major depressive disorder with single episode, in partial remission (HCC) (Chronic)    Clinically stable on current regimen with good control of symptoms, No SI or HI. Will continue Lexapro, Trazodone, Seroquel and Bupropion.       Hyperlipidemia associated with type 2 diabetes mellitus (HCC) (Chronic)    LDL is  Lab Results  Component Value Date   LDLCALC 75 04/28/2022   Currently being treated with atorvastatin with good compliance and no concerns.       Relevant Orders   Lipid panel   Essential hypertension (Chronic)    Normal exam with stable BP on atenolol. No concerns or side effects to current medication. No change in regimen; continue low sodium diet.  Relevant Orders   CBC with Differential/Platelet   Other Visit Diagnoses     Need for influenza vaccination    -  Primary   Relevant Orders   Flu Vaccine Trivalent High Dose (Fluad) (Completed)   Long  term current use of oral hypoglycemic drug           Return in about 4 months (around 09/05/2023) for DM, HTN, Depression.    Reubin Milan, MD [facility] [place]

## 2023-05-06 NOTE — Assessment & Plan Note (Signed)
She is doing well on Lyrica prescribed by pain management.

## 2023-06-05 ENCOUNTER — Other Ambulatory Visit: Payer: Self-pay | Admitting: Internal Medicine

## 2023-06-05 DIAGNOSIS — I10 Essential (primary) hypertension: Secondary | ICD-10-CM

## 2023-06-07 NOTE — Telephone Encounter (Signed)
Requested Prescriptions  Pending Prescriptions Disp Refills   atenolol (TENORMIN) 25 MG tablet [Pharmacy Med Name: Atenolol 25 MG Oral Tablet] 90 tablet 1    Sig: TAKE 1 TABLET BY MOUTH DAILY     Cardiovascular: Beta Blockers 2 Failed - 06/05/2023  5:04 AM      Failed - Cr in normal range and within 360 days    Creatinine, Ser  Date Value Ref Range Status  04/28/2022 0.80 0.57 - 1.00 mg/dL Final         Passed - Last BP in normal range    BP Readings from Last 1 Encounters:  05/06/23 102/62         Passed - Last Heart Rate in normal range    Pulse Readings from Last 1 Encounters:  05/06/23 70         Passed - Valid encounter within last 6 months    Recent Outpatient Visits           1 month ago Need for influenza vaccination   [place] [facility] at [facility], Nyoka Cowden, MD   5 months ago Essential hypertension   [place] [facility] at [facility], Nyoka Cowden, MD   7 months ago Multilevel lumbosacral spondylosis with radiculopathy   [place] [facility] at [facility], Ocie Bob, MD   9 months ago Multilevel lumbosacral spondylosis with radiculopathy   [facility] at [facility], Ocie Bob, MD   9 months ago Type II diabetes mellitus with complication [facility])    Primary Care & Sports Medicine at [facility], Nyoka Cowden, MD       Future Appointments             In 3 months Judithann Graves, Nyoka Cowden, MD [facility] at [facility]

## 2023-07-24 ENCOUNTER — Other Ambulatory Visit: Payer: Self-pay | Admitting: Internal Medicine

## 2023-07-24 DIAGNOSIS — F324 Major depressive disorder, single episode, in partial remission: Secondary | ICD-10-CM

## 2023-07-26 NOTE — Telephone Encounter (Signed)
Requested Prescriptions  Pending Prescriptions Disp Refills   escitalopram (LEXAPRO) 10 MG tablet [Pharmacy Med Name: Escitalopram Oxalate 10 MG Oral Tablet] 90 tablet 1    Sig: TAKE 1 TABLET BY MOUTH DAILY     Psychiatry:  Antidepressants - SSRI Passed - 07/24/2023  4:40 AM      Passed - Completed PHQ-2 or PHQ-9 in the last 360 days      Passed - Valid encounter within last 6 months    Recent Outpatient Visits           2 months ago Need for influenza vaccination   [employer] [facility] at [facility], Nyoka Cowden, MD   7 months ago Essential hypertension   [place] [facility] at [facility], Nyoka Cowden, MD   9 months ago Multilevel lumbosacral spondylosis with radiculopathy   [place] [facility] at [facility], Ocie Bob, MD   10 months ago Multilevel lumbosacral spondylosis with radiculopathy   [facility] at [facility], Ocie Bob, MD   11 months ago Type II diabetes mellitus with complication [facility])   [place] [facility] at [facility], Nyoka Cowden, MD       Future Appointments             In 1 month Judithann Graves, Nyoka Cowden, MD [facility] at [facility], [facility]

## 2023-08-17 LAB — COMPREHENSIVE METABOLIC PANEL
ALT: 19 [IU]/L (ref 0–32)
AST: 19 [IU]/L (ref 0–40)
Albumin: 4.4 g/dL (ref 3.8–4.8)
Alkaline Phosphatase: 65 [IU]/L (ref 44–121)
BUN/Creatinine Ratio: 21 (ref 12–28)
BUN: 19 mg/dL (ref 8–27)
Bilirubin Total: 0.5 mg/dL (ref 0.0–1.2)
CO2: 24 mmol/L (ref 20–29)
Calcium: 9.3 mg/dL (ref 8.7–10.3)
Chloride: 103 mmol/L (ref 96–106)
Creatinine, Ser: 0.89 mg/dL (ref 0.57–1.00)
Globulin, Total: 1.6 g/dL (ref 1.5–4.5)
Glucose: 160 mg/dL — ABNORMAL HIGH (ref 70–99)
Potassium: 4.2 mmol/L (ref 3.5–5.2)
Sodium: 141 mmol/L (ref 134–144)
Total Protein: 6 g/dL (ref 6.0–8.5)
eGFR: 68 mL/min/{1.73_m2} (ref >59)

## 2023-08-17 LAB — CBC WITH DIFFERENTIAL/PLATELET
Basophils Absolute: 0.1 10*3/uL (ref 0.0–0.2)
Basos: 1 %
EOS (ABSOLUTE): 0.1 10*3/uL (ref 0.0–0.4)
Eos: 2 %
Hematocrit: 38.9 % (ref 34.0–46.6)
Hemoglobin: 12.4 g/dL (ref 11.1–15.9)
Immature Grans (Abs): 0 10*3/uL (ref 0.0–0.1)
Immature Granulocytes: 0 %
Lymphocytes Absolute: 1.9 10*3/uL (ref 0.7–3.1)
Lymphs: 35 %
MCH: 27.5 pg (ref 26.6–33.0)
MCHC: 31.9 g/dL (ref 31.5–35.7)
MCV: 86 fL (ref 79–97)
Monocytes Absolute: 0.4 10*3/uL (ref 0.1–0.9)
Monocytes: 8 %
Neutrophils Absolute: 2.8 10*3/uL (ref 1.4–7.0)
Neutrophils: 54 %
Platelets: 270 10*3/uL (ref 150–450)
RBC: 4.51 x10E6/uL (ref 3.77–5.28)
RDW: 12.4 % (ref 11.7–15.4)
WBC: 5.3 10*3/uL (ref 3.4–10.8)

## 2023-08-17 LAB — HEMOGLOBIN A1C
Est. average glucose Bld gHb Est-mCnc: 157 mg/dL
Hgb A1c MFr Bld: 7.1 % — ABNORMAL HIGH (ref 4.8–5.6)

## 2023-08-17 LAB — LIPID PANEL
Chol/HDL Ratio: 2.4 {ratio} (ref 0.0–4.4)
Cholesterol, Total: 173 mg/dL (ref 100–199)
HDL: 71 mg/dL (ref >39)
LDL Chol Calc (NIH): 81 mg/dL (ref 0–99)
Triglycerides: 118 mg/dL (ref 0–149)
VLDL Cholesterol Cal: 21 mg/dL (ref 5–40)

## 2023-08-17 LAB — MICROALBUMIN / CREATININE URINE RATIO
Creatinine, Urine: 198.6 mg/dL
Microalb/Creat Ratio: 7 mg/g{creat} (ref 0–29)
Microalbumin, Urine: 13.9 ug/mL

## 2023-09-01 ENCOUNTER — Ambulatory Visit (INDEPENDENT_AMBULATORY_CARE_PROVIDER_SITE_OTHER): Payer: Medicare Other | Admitting: Internal Medicine

## 2023-09-01 ENCOUNTER — Encounter: Payer: Self-pay | Admitting: Internal Medicine

## 2023-09-01 VITALS — BP 114/62 | HR 86 | Ht 67.0 in | Wt 191.0 lb

## 2023-09-01 DIAGNOSIS — Z7984 Long term (current) use of oral hypoglycemic drugs: Secondary | ICD-10-CM

## 2023-09-01 DIAGNOSIS — F5101 Primary insomnia: Secondary | ICD-10-CM | POA: Diagnosis not present

## 2023-09-01 DIAGNOSIS — E118 Type 2 diabetes mellitus with unspecified complications: Secondary | ICD-10-CM

## 2023-09-01 DIAGNOSIS — M48062 Spinal stenosis, lumbar region with neurogenic claudication: Secondary | ICD-10-CM | POA: Diagnosis not present

## 2023-09-01 MED ORDER — OZEMPIC (0.25 OR 0.5 MG/DOSE) 2 MG/3ML ~~LOC~~ SOPN: PEN_INJECTOR | SUBCUTANEOUS | 0 refills | Status: AC

## 2023-09-01 MED ORDER — TRAZODONE HCL 50 MG PO TABS: 50.0000 mg | ORAL_TABLET | Freq: Every day | ORAL | 1 refills | Status: DC

## 2023-09-01 NOTE — Patient Instructions (Signed)
 Take Ozempic  0.25 mg weekly for 4 weeks then take 0.5 mg weekly for 2 weeks.  Then call for a new Rx of the 1 mg weekly dose.

## 2023-09-01 NOTE — Progress Notes (Signed)
 Date:  09/01/2023   Name:  Sheri Green   DOB:  [birth year]   MRN:  [id number]   Chief Complaint: Hypertension and Diabetes (Discuss Ozempic .)  Back Pain This is a chronic problem. The problem is unchanged. The pain is present in the lumbar spine. The quality of the pain is described as aching. The pain is mild. Pertinent negatives include no abdominal pain, chest pain, dysuria, fever, headaches or numbness.  Diabetes She presents for her follow-up diabetic visit. She has type 2 diabetes mellitus. Her disease course has been stable. Pertinent negatives for hypoglycemia include no headaches, nervousness/anxiousness or tremors. Pertinent negatives for diabetes include no chest pain, no fatigue, no polydipsia and no polyuria. Current diabetic treatment includes oral agent (monotherapy) (metformin ). She is compliant with treatment all of the time.    Review of Systems  Constitutional:  Negative for appetite change, fatigue, fever and unexpected weight change.  HENT:  Negative for tinnitus and trouble swallowing.   Eyes:  Negative for visual disturbance.  Respiratory:  Negative for cough, chest tightness and shortness of breath.   Cardiovascular:  Negative for chest pain, palpitations and leg swelling.  Gastrointestinal:  Negative for abdominal pain.  Endocrine: Negative for polydipsia and polyuria.  Genitourinary:  Negative for dysuria and hematuria.  Musculoskeletal:  Positive for back pain and gait problem. Negative for arthralgias.  Neurological:  Negative for tremors, numbness and headaches.  Psychiatric/Behavioral:  Negative for dysphoric mood and sleep disturbance. The patient is not nervous/anxious.      Lab Results  Component Value Date   NA 141 08/16/2023   K 4.2 08/16/2023   CO2 24 08/16/2023   GLUCOSE 160 (H) 08/16/2023   BUN 19 08/16/2023   CREATININE 0.89 08/16/2023   CALCIUM  9.3 08/16/2023   EGFR 68 08/16/2023   Lab Results  Component Value Date   CHOL 173  08/16/2023   HDL 71 08/16/2023   LDLCALC 81 08/16/2023   TRIG 118 08/16/2023   CHOLHDL 2.4 08/16/2023   Lab Results  Component Value Date   TSH 3.020 04/28/2022   Lab Results  Component Value Date   HGBA1C 7.1 (H) 08/16/2023   Lab Results  Component Value Date   WBC 5.3 08/16/2023   HGB 12.4 08/16/2023   HCT 38.9 08/16/2023   MCV 86 08/16/2023   PLT 270 08/16/2023   Lab Results  Component Value Date   ALT 19 08/16/2023   AST 19 08/16/2023   ALKPHOS 65 08/16/2023   BILITOT 0.5 08/16/2023   No results found for: "25OHVITD2", "25OHVITD3", "VD25OH"   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   Spinal stenosis, lumbar region, 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   Hyperlipidemia associated with type 2 diabetes mellitus (HCC) 08/17/1998   Essential hypertension 08/17/1988   Insomnia 08/17/1986    Allergies  Allergen Reactions   Diltiazem Hives    Past Surgical History:  Procedure Laterality Date  ABDOMINAL HYSTERECTOMY  1994   BARIATRIC SURGERY  2015   gastric sleeve   ESOPHAGOGASTRODUODENOSCOPY (EGD) WITH PROPOFOL  N/A 09/07/2022   Procedure: ESOPHAGOGASTRODUODENOSCOPY (EGD) WITH PROPOFOL ;  Surgeon: Marnee Sink, MD;  Location: [facility];  Service: Endoscopy;  Laterality: N/A;   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   UPPER GI ENDOSCOPY  2023     Social History   Tobacco Use   Smoking status: Never   Smokeless tobacco: Never  Substance Use Topics   Alcohol use: Yes    Comment: rare   Drug use: No     Medication list has been reviewed and updated.  Current Meds  Medication Sig   atenolol  (TENORMIN ) 25 MG tablet TAKE 1 TABLET BY MOUTH DAILY   atorvastatin  (LIPITOR) 40 MG tablet Take 1 tablet (40 mg total) by mouth daily.   buPROPion  (WELLBUTRIN  XL) 300 MG 24 hr tablet TAKE 1 TABLET BY MOUTH DAILY   escitalopram  (LEXAPRO ) 10 MG tablet TAKE 1 TABLET BY MOUTH DAILY   latanoprost (XALATAN) 0.005 % ophthalmic solution 1 drop at bedtime.   meloxicam  (MOBIC ) 7.5 MG tablet Take 1 tablet (7.5 mg total) by mouth daily as needed for pain. Take with food.   metFORMIN  (GLUCOPHAGE ) 500 MG tablet TAKE 1 TABLET BY MOUTH TWICE  DAILY WITH MEALS   pantoprazole (PROTONIX) 20 MG tablet Take 20 mg by mouth daily.   QUEtiapine  (SEROQUEL ) 25 MG tablet TAKE 1 TABLET BY MOUTH AT  BEDTIME AS NEEDED   Semaglutide ,0.25 or 0.5MG /DOS, (OZEMPIC , 0.25 OR 0.5 MG/DOSE,) 2 MG/3ML SOPN Inject 0.25 mg into the skin once a week for 28 days, THEN 0.5 mg once a week for 14 days.   UNABLE TO FIND Med Name: Nature's Bounty Vegitable and Fruit Supplement   [DISCONTINUED] traZODone  (DESYREL ) 50 MG tablet Take 50 mg by mouth at bedtime.       09/01/2023    3:06 PM 05/06/2023    9:59 AM 12/22/2022    3:12 PM 08/25/2022    2:29 PM  GAD 7 : Generalized Anxiety Score  Nervous, Anxious, on Edge 2 1 0 1  Control/stop worrying 1 1 0 2  Worry too much - different things 1 1 0 2  Trouble relaxing 3 1 3  0  Restless 1 1 0 0  Easily annoyed or irritable 1 0 0 0  Afraid - awful might happen 0 0 0 0  Total GAD 7 Score 9 5 3 5   Anxiety Difficulty Very difficult Somewhat difficult Not difficult at all Not difficult at all       09/01/2023    3:06 PM 05/06/2023    9:59 AM 01/12/2023   11:23 AM  Depression screen PHQ 2/9  Decreased Interest 2 1 0  Down, Depressed,  Hopeless 0 1 0  PHQ - 2 Score 2 2 0  Altered sleeping 1 0   Tired, decreased energy 3 3   Change in appetite 3 1   Feeling bad or failure about yourself  0 0   Trouble concentrating 1 0   Moving slowly or fidgety/restless 1 0   Suicidal thoughts 0 0   PHQ-9 Score 11 6   Difficult doing work/chores Not difficult at all Not difficult at all     BP Readings from Last 3 Encounters:  09/01/23 114/62  05/06/23 102/62  03/17/23 107/60    Physical Exam Vitals and nursing note reviewed.  Constitutional:      General: She is not in acute distress.  Appearance: Normal appearance. She is well-developed.  HENT:     Head: Normocephalic and atraumatic.  Cardiovascular:     Rate and Rhythm: Normal rate and regular rhythm.  Pulmonary:     Effort: Pulmonary effort is normal. No respiratory distress.     Breath sounds: No wheezing or rhonchi.  Musculoskeletal:     Cervical back: Normal range of motion.     Right lower leg: No edema.     Left lower leg: No edema.  Skin:    General: Skin is warm and dry.     Capillary Refill: Capillary refill takes less than 2 seconds.     Findings: No rash.  Neurological:     Mental Status: She is alert and oriented to person, place, and time.  Psychiatric:        Mood and Affect: Mood normal.        Behavior: Behavior normal.     Wt Readings from Last 3 Encounters:  09/01/23 191 lb (86.6 kg)  05/06/23 183 lb 3.2 oz (83.1 kg)  03/17/23 175 lb (79.4 kg)    BP 114/62   Pulse 86   Ht 5\' 7"  (1.702 m)   Wt 191 lb (86.6 kg)   SpO2 99%   BMI 29.91 kg/m   Assessment and Plan:  Problem List Items Addressed This Visit       Unprioritized   Type II diabetes mellitus with complication (HCC) - Primary (Chronic)   Blood sugars stable without hypoglycemic symptoms or events. Currently managed with metformin . Changes made last visit are none. Lab Results  Component Value Date   HGBA1C 7.1 (H) 08/16/2023  She wants to try Ozempic  to help with  DM control, weight loss and back pain.      Relevant Medications   Semaglutide ,0.25 or 0.5MG /DOS, (OZEMPIC , 0.25 OR 0.5 MG/DOSE,) 2 MG/3ML SOPN   Spinal stenosis, lumbar region, with neurogenic claudication   Being followed by Pain management but has been seen recently.      Relevant Medications   traZODone  (DESYREL ) 50 MG tablet   Insomnia   Relevant Medications   traZODone  (DESYREL ) 50 MG tablet   Other Visit Diagnoses       Long term current use of oral hypoglycemic drug           Return in about 3 months (around 11/30/2023) for DM, HTN.    Sheron Dixons, MD [facility] [place]

## 2023-09-01 NOTE — Assessment & Plan Note (Addendum)
 Blood sugars stable without hypoglycemic symptoms or events. Currently managed with metformin . Changes made last visit are none. Lab Results  Component Value Date   HGBA1C 7.1 (H) 08/16/2023  She wants to try Ozempic  to help with DM control, weight loss and back pain.

## 2023-09-01 NOTE — Assessment & Plan Note (Signed)
 Being followed by Pain management but has been seen recently.

## 2023-09-02 ENCOUNTER — Telehealth: Payer: Self-pay

## 2023-09-02 NOTE — Telephone Encounter (Signed)
PA was Approved through  August 16, 2024.

## 2023-09-02 NOTE — Telephone Encounter (Signed)
Completed PA for Ozempic on [email or web address].  (Key: [id number]) Rx #: [id number] Ozempic (0.25 or 0.5 MG/DOSE) 2MG /3ML pen-injectors  Form [employer] Medicare Part D Electronic Prior Authorization Form (2017 NCPDP)  Awaiting outcome.

## 2023-09-07 ENCOUNTER — Ambulatory Visit: Payer: Medicare Other | Admitting: Internal Medicine

## 2023-09-22 ENCOUNTER — Other Ambulatory Visit: Payer: Self-pay | Admitting: Internal Medicine

## 2023-09-22 DIAGNOSIS — E785 Hyperlipidemia, unspecified: Secondary | ICD-10-CM

## 2023-09-23 NOTE — Telephone Encounter (Signed)
 Requested Prescriptions  Pending Prescriptions Disp Refills   buPROPion  (WELLBUTRIN  XL) 300 MG 24 hr tablet [Pharmacy Med Name: buPROPion  HCl ER (XL) 300 MG Oral Tablet Extended Release 24 Hour] 90 tablet 1    Sig: TAKE 1 TABLET BY MOUTH DAILY     Psychiatry: Antidepressants - bupropion  Passed - 09/23/2023  1:09 PM      Passed - Cr in normal range and within 360 days    Creatinine, Ser  Date Value Ref Range Status  08/16/2023 0.89 0.57 - 1.00 mg/dL Final         Passed - AST in normal range and within 360 days    AST  Date Value Ref Range Status  08/16/2023 19 0 - 40 IU/L Final         Passed - ALT in normal range and within 360 days    ALT  Date Value Ref Range Status  08/16/2023 19 0 - 32 IU/L Final         Passed - Completed PHQ-2 or PHQ-9 in the last 360 days      Passed - Last BP in normal range    BP Readings from Last 1 Encounters:  09/01/23 114/62         Passed - Valid encounter within last 6 months    Recent Outpatient Visits           3 weeks ago Type II diabetes mellitus with complication [facility])   [place] [facility] at [facility], Leita DEL, MD   4 months ago Need for influenza vaccination   [facility] at [facility], Leita DEL, MD   9 months ago Essential hypertension   [place] [facility] at [facility], Leita DEL, MD   10 months ago Multilevel lumbosacral spondylosis with radiculopathy   [place] [facility] at [facility], Selinda PARAS, MD   1 year ago Multilevel lumbosacral spondylosis with radiculopathy   [place] [facility] at [facility], Selinda PARAS, MD       Future Appointments             In 2 months Justus Leita DEL, MD [facility] at [facility] [place], [facility]             atorvastatin  (LIPITOR) 40 MG tablet [Pharmacy Med Name:  Atorvastatin  Calcium  40 MG Oral Tablet] 90 tablet 1    Sig: TAKE 1 TABLET BY MOUTH DAILY     Cardiovascular:  Antilipid - Statins Failed - 09/23/2023  1:09 PM      Failed - Lipid Panel in normal range within the last 12 months    Cholesterol, Total  Date Value Ref Range Status  08/16/2023 173 100 - 199 mg/dL Final   LDL Chol Calc (NIH)  Date Value Ref Range Status  08/16/2023 81 0 - 99 mg/dL Final   HDL  Date Value Ref Range Status  08/16/2023 71 >39 mg/dL Final   Triglycerides  Date Value Ref Range Status  08/16/2023 118 0 - 149 mg/dL Final         Passed - Patient is not pregnant      Passed - Valid encounter within last 12 months    Recent Outpatient Visits           3 weeks ago Type II diabetes mellitus with complication (HCC)  [facility] at [facility], Leita DEL, MD   4 months ago Need for influenza vaccination   [facility] Medicine at [facility], Leita DEL, MD   9 months ago Essential hypertension   [place] [facility] at [facility], Leita DEL, MD   10 months ago Multilevel lumbosacral spondylosis with radiculopathy   [facility] at [facility], Selinda PARAS, MD   1 year ago Multilevel lumbosacral spondylosis with radiculopathy   [facility] & [facility] Medicine at [facility], Selinda PARAS, MD       Future Appointments             In 2 months Justus, Leita DEL, MD [facility] Primary Care & Sports Medicine at [facility], [facility]

## 2023-10-19 ENCOUNTER — Telehealth: Payer: Self-pay | Admitting: Internal Medicine

## 2023-10-19 ENCOUNTER — Other Ambulatory Visit: Payer: Self-pay | Admitting: Internal Medicine

## 2023-10-19 DIAGNOSIS — E118 Type 2 diabetes mellitus with unspecified complications: Secondary | ICD-10-CM

## 2023-10-19 MED ORDER — SEMAGLUTIDE (1 MG/DOSE) 4 MG/3ML ~~LOC~~ SOPN: 1.0000 mg | PEN_INJECTOR | SUBCUTANEOUS | 1 refills | Status: DC

## 2023-10-19 NOTE — Telephone Encounter (Signed)
 Patient requesting refill for Medication: Semaglutide,0.25 or 0.5MG /DOS, (OZEMPIC, 0.25 OR 0.5 MG/DOSE,) 2 MG/3ML SOPN. Rx expired on 10/13/23 so it is not on current medication list. Please advise

## 2023-10-19 NOTE — Telephone Encounter (Signed)
 Copied from CRM [id number]. Topic: Clinical - Medication Refill >> Oct 19, 2023 11:54 AM [place] B wrote: Most Recent Primary Care Visit:  Provider: Reubin Milan  Department: [facility] [place]  Visit Type: OFFICE VISIT  Date: 09/01/2023  Medication: Semaglutide,0.25 or 0.5MG /DOS, (OZEMPIC, 0.25 OR 0.5 MG/DOSE,) 2 MG/3ML SOPN  Has the patient contacted their pharmacy? no Pt thought she had to call in  Is this the correct pharmacy for this prescription? yes  This is the patient's preferred pharmacy:   [id number] [place] - [address] AT [facility] [address] [place] [address] [place] [address] [place] [address] Phone: [phone] Fax: [phone]   Has the prescription been filled recently? no  Is the patient out of the medication? yes  Has the patient been seen for an appointment in the last year OR does the patient have an upcoming appointment? yes  Can we respond through MyChart? yes  Agent: Please be advised that Rx refills may take up to 3 business days. We ask that you follow-up with your pharmacy.

## 2023-10-19 NOTE — Telephone Encounter (Signed)
 Med refill for Semaglutide, 0.25 or 0.5 MG/DOS

## 2023-10-19 NOTE — Telephone Encounter (Signed)
 Spoke with patient. She verbalized understanding and stated she was okay with the mail order.

## 2023-11-03 ENCOUNTER — Other Ambulatory Visit: Payer: Self-pay | Admitting: Internal Medicine

## 2023-11-03 ENCOUNTER — Ambulatory Visit: Admitting: Internal Medicine

## 2023-11-03 DIAGNOSIS — E118 Type 2 diabetes mellitus with unspecified complications: Secondary | ICD-10-CM

## 2023-11-03 MED ORDER — OZEMPIC (0.25 OR 0.5 MG/DOSE) 2 MG/3ML ~~LOC~~ SOPN: 0.5000 mg | PEN_INJECTOR | SUBCUTANEOUS | 0 refills | Status: DC

## 2023-11-03 NOTE — Progress Notes (Unsigned)
 Date:  11/03/2023   Name:  Sheri Green   DOB:  1945/08/22   MRN:  [id number]   Chief Complaint: No chief complaint on file.  HPI  Review of Systems   Lab Results  Component Value Date   NA 141 08/16/2023   K 4.2 08/16/2023   CO2 24 08/16/2023   GLUCOSE 160 (H) 08/16/2023   BUN 19 08/16/2023   CREATININE 0.89 08/16/2023   CALCIUM 9.3 08/16/2023   EGFR 68 08/16/2023   Lab Results  Component Value Date   CHOL 173 08/16/2023   HDL 71 08/16/2023   LDLCALC 81 08/16/2023   TRIG 118 08/16/2023   CHOLHDL 2.4 08/16/2023   Lab Results  Component Value Date   TSH 3.020 04/28/2022   Lab Results  Component Value Date   HGBA1C 7.1 (H) 08/16/2023   Lab Results  Component Value Date   WBC 5.3 08/16/2023   HGB 12.4 08/16/2023   HCT 38.9 08/16/2023   MCV 86 08/16/2023   PLT 270 08/16/2023   Lab Results  Component Value Date   ALT 19 08/16/2023   AST 19 08/16/2023   ALKPHOS 65 08/16/2023   BILITOT 0.5 08/16/2023   No results found for: "25OHVITD2", "25OHVITD3", "VD25OH"   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   Spinal stenosis, lumbar region, 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   Hyperlipidemia associated with type 2 diabetes mellitus (HCC) 08/17/1998   Essential hypertension 08/17/1988   Insomnia 08/17/1986    Allergies  Allergen Reactions    Diltiazem Hives    Past Surgical History:  Procedure Laterality Date   ABDOMINAL HYSTERECTOMY  1994   BARIATRIC SURGERY  2015   gastric sleeve   ESOPHAGOGASTRODUODENOSCOPY (EGD) WITH PROPOFOL N/A 09/07/2022   Procedure: ESOPHAGOGASTRODUODENOSCOPY (EGD) WITH PROPOFOL;  Surgeon: Midge Minium, MD;  Location: [facility];  Service: Endoscopy;  Laterality: N/A;   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   UPPER GI ENDOSCOPY  2023    Social History   Tobacco Use   Smoking status: Never   Smokeless tobacco: Never  Substance Use Topics   Alcohol use: Yes    Comment: rare   Drug use: No     Medication list has been reviewed and updated.  No outpatient medications have been marked as taking for the 11/03/23 encounter (Orders Only) with Reubin Milan, MD.       09/01/2023    3:06 PM 05/06/2023    9:59 AM 12/22/2022    3:12 PM 08/25/2022    2:29 PM  GAD 7 : Generalized Anxiety Score  Nervous, Anxious, on Edge 2 1 0 1  Control/stop worrying 1 1 0 2  Worry too much - different things 1 1 0 2  Trouble relaxing 3 1 3  0  Restless 1 1 0 0  Easily annoyed or irritable 1 0 0 0  Afraid - awful might happen 0 0 0 0  Total GAD 7 Score 9 5 3 5   Anxiety Difficulty Very difficult Somewhat difficult Not difficult at all Not difficult at all       09/01/2023    3:06 PM 05/06/2023    9:59 AM 01/12/2023   11:23 AM  Depression screen PHQ 2/9  Decreased Interest 2 1 0  Down, Depressed, Hopeless 0 1 0  PHQ - 2 Score 2 2 0  Altered sleeping 1 0   Tired, decreased energy 3 3   Change in appetite 3 1   Feeling bad or failure about yourself  0 0   Trouble concentrating 1 0   Moving slowly or fidgety/restless 1 0   Suicidal thoughts 0 0   PHQ-9 Score 11 6   Difficult doing work/chores Not difficult at all Not difficult at all     BP Readings from Last 3 Encounters:  09/01/23 114/62  05/06/23 102/62  03/17/23 107/60    Physical Exam  Wt Readings from Last 3 Encounters:  09/01/23  191 lb (86.6 kg)  05/06/23 183 lb 3.2 oz (83.1 kg)  03/17/23 175 lb (79.4 kg)    There were no vitals taken for this visit.  Assessment and Plan:  Problem List Items Addressed This Visit   None   No follow-ups on file.    Reubin Milan, MD [facility] [place]

## 2023-11-15 ENCOUNTER — Other Ambulatory Visit: Payer: Self-pay | Admitting: Internal Medicine

## 2023-11-15 DIAGNOSIS — I10 Essential (primary) hypertension: Secondary | ICD-10-CM

## 2023-11-16 NOTE — Telephone Encounter (Signed)
 Requested Prescriptions  Pending Prescriptions Disp Refills   atenolol (TENORMIN) 25 MG tablet [Pharmacy Med Name: Atenolol 25 MG Oral Tablet] 90 tablet 3    Sig: TAKE 1 TABLET BY MOUTH DAILY     Cardiovascular: Beta Blockers 2 Failed - 11/16/2023  4:50 PM      Failed - Valid encounter within last 6 months    Recent Outpatient Visits   None     Future Appointments             In 2 weeks Reubin Milan, MD [facility] Primary Care & Sports Medicine at [facility], [facility]            Passed - Cr in normal range and within 360 days    Creatinine, Ser  Date Value Ref Range Status  08/16/2023 0.89 0.57 - 1.00 mg/dL Final         Passed - Last BP in normal range    BP Readings from Last 1 Encounters:  09/01/23 114/62         Passed - Last Heart Rate in normal range    Pulse Readings from Last 1 Encounters:  09/01/23 86

## 2023-11-30 ENCOUNTER — Ambulatory Visit: Payer: Self-pay | Admitting: Internal Medicine

## 2023-12-20 ENCOUNTER — Other Ambulatory Visit: Payer: Self-pay | Admitting: Internal Medicine

## 2023-12-20 DIAGNOSIS — E118 Type 2 diabetes mellitus with unspecified complications: Secondary | ICD-10-CM

## 2023-12-21 NOTE — Telephone Encounter (Signed)
 Requested Prescriptions  Pending Prescriptions Disp Refills   Semaglutide ,0.25 or 0.5MG /DOS, (OZEMPIC , 0.25 OR 0.5 MG/DOSE,) 2 MG/3ML SOPN [Pharmacy Med Name: OZEMPIC   0.25MG  0.5MG  SOLUTION PEN-INJECTOR  PEN DOSE] 6 mL 0    Sig: INJECT SUBCUTANEOUSLY 0.5 MG  EVERY WEEK     Endocrinology:  Diabetes - GLP-1 Receptor Agonists - semaglutide  Failed - 12/21/2023  4:12 PM      Failed - HBA1C in normal range and within 180 days    Hemoglobin A1C  Date Value Ref Range Status  12/19/2021 5.8  Final   Hgb A1c MFr Bld  Date Value Ref Range Status  08/16/2023 7.1 (H) 4.8 - 5.6 % Final    Comment:             Prediabetes: 5.7 - 6.4          Diabetes: >6.4          Glycemic control for adults with diabetes: <7.0          Failed - Valid encounter within last 6 months    Recent Outpatient Visits   None            Passed - Cr in normal range and within 360 days    Creatinine, Ser  Date Value Ref Range Status  08/16/2023 0.89 0.57 - 1.00 mg/dL Final

## 2023-12-23 ENCOUNTER — Other Ambulatory Visit: Payer: Self-pay | Admitting: Internal Medicine

## 2023-12-24 NOTE — Telephone Encounter (Signed)
 Patient have an office visit prior to next refill. Requested Prescriptions  Pending Prescriptions Disp Refills   metFORMIN  (GLUCOPHAGE ) 500 MG tablet [Pharmacy Med Name: metFORMIN  HCl 500 MG Oral Tablet] 180 tablet 0    Sig: TAKE 1 TABLET BY MOUTH TWICE  DAILY WITH MEALS     Endocrinology:  Diabetes - Biguanides Failed - 12/24/2023  2:46 PM      Failed - B12 Level in normal range and within 720 days    No results found for: "VITAMINB12"       Failed - Valid encounter within last 6 months    Recent Outpatient Visits   None            Passed - Cr in normal range and within 360 days    Creatinine, Ser  Date Value Ref Range Status  08/16/2023 0.89 0.57 - 1.00 mg/dL Final         Passed - HBA1C is between 0 and 7.9 and within 180 days    Hemoglobin A1C  Date Value Ref Range Status  12/19/2021 5.8  Final   Hgb A1c MFr Bld  Date Value Ref Range Status  08/16/2023 7.1 (H) 4.8 - 5.6 % Final    Comment:             Prediabetes: 5.7 - 6.4          Diabetes: >6.4          Glycemic control for adults with diabetes: <7.0          Passed - eGFR in normal range and within 360 days    eGFR  Date Value Ref Range Status  08/16/2023 68 >59 mL/min/1.73 Final         Passed - CBC within normal limits and completed in the last 12 months    WBC  Date Value Ref Range Status  08/16/2023 5.3 3.4 - 10.8 x10E3/uL Final   RBC  Date Value Ref Range Status  08/16/2023 4.51 3.77 - 5.28 x10E6/uL Final  12/19/2021 4.42 3.87 - 5.11 Final   Hemoglobin  Date Value Ref Range Status  08/16/2023 12.4 11.1 - 15.9 g/dL Final   Hematocrit  Date Value Ref Range Status  08/16/2023 38.9 34.0 - 46.6 % Final   MCHC  Date Value Ref Range Status  08/16/2023 31.9 31.5 - 35.7 g/dL Final   [facility]  Date Value Ref Range Status  08/16/2023 27.5 26.6 - 33.0 pg Final   MCV  Date Value Ref Range Status  08/16/2023 86 79 - 97 fL Final   No results found for: "PLTCOUNTKUC", "LABPLAT", "POCPLA" RDW  Date  Value Ref Range Status  08/16/2023 12.4 11.7 - 15.4 % Final

## 2024-01-12 ENCOUNTER — Encounter: Payer: Self-pay | Admitting: Internal Medicine

## 2024-01-13 ENCOUNTER — Other Ambulatory Visit: Payer: Self-pay

## 2024-01-13 DIAGNOSIS — E118 Type 2 diabetes mellitus with unspecified complications: Secondary | ICD-10-CM

## 2024-01-14 ENCOUNTER — Ambulatory Visit (INDEPENDENT_AMBULATORY_CARE_PROVIDER_SITE_OTHER): Admitting: Internal Medicine

## 2024-01-14 ENCOUNTER — Encounter: Payer: Self-pay | Admitting: Internal Medicine

## 2024-01-14 VITALS — BP 120/76 | HR 99 | Ht 67.0 in | Wt 183.0 lb

## 2024-01-14 DIAGNOSIS — E785 Hyperlipidemia, unspecified: Secondary | ICD-10-CM

## 2024-01-14 DIAGNOSIS — Z1231 Encounter for screening mammogram for malignant neoplasm of breast: Secondary | ICD-10-CM | POA: Diagnosis not present

## 2024-01-14 DIAGNOSIS — Z7984 Long term (current) use of oral hypoglycemic drugs: Secondary | ICD-10-CM

## 2024-01-14 DIAGNOSIS — E1169 Type 2 diabetes mellitus with other specified complication: Secondary | ICD-10-CM

## 2024-01-14 DIAGNOSIS — E118 Type 2 diabetes mellitus with unspecified complications: Secondary | ICD-10-CM | POA: Diagnosis not present

## 2024-01-14 DIAGNOSIS — I10 Essential (primary) hypertension: Secondary | ICD-10-CM

## 2024-01-14 LAB — CBC
Hematocrit: 38.2 % (ref 34.0–46.6)
Hemoglobin: 12 g/dL (ref 11.1–15.9)
MCH: 27.5 pg (ref 26.6–33.0)
MCHC: 31.4 g/dL — ABNORMAL LOW (ref 31.5–35.7)
MCV: 87 fL (ref 79–97)
Platelets: 268 10*3/uL (ref 150–450)
RBC: 4.37 x10E6/uL (ref 3.77–5.28)
RDW: 12.9 % (ref 11.7–15.4)
WBC: 4.2 10*3/uL (ref 3.4–10.8)

## 2024-01-14 LAB — LIPID PANEL
Chol/HDL Ratio: 2.1 ratio (ref 0.0–4.4)
Cholesterol, Total: 156 mg/dL (ref 100–199)
HDL: 73 mg/dL (ref >39)
LDL Chol Calc (NIH): 61 mg/dL (ref 0–99)
Triglycerides: 126 mg/dL (ref 0–149)
VLDL Cholesterol Cal: 22 mg/dL (ref 5–40)

## 2024-01-14 LAB — COMPREHENSIVE METABOLIC PANEL WITH GFR
ALT: 18 IU/L (ref 0–32)
AST: 24 IU/L (ref 0–40)
Albumin: 4.7 g/dL (ref 3.8–4.8)
Alkaline Phosphatase: 59 IU/L (ref 44–121)
BUN/Creatinine Ratio: 22 (ref 12–28)
BUN: 16 mg/dL (ref 8–27)
Bilirubin Total: 0.4 mg/dL (ref 0.0–1.2)
CO2: 20 mmol/L (ref 20–29)
Calcium: 9.4 mg/dL (ref 8.7–10.3)
Chloride: 105 mmol/L (ref 96–106)
Creatinine, Ser: 0.74 mg/dL (ref 0.57–1.00)
Globulin, Total: 1.9 g/dL (ref 1.5–4.5)
Glucose: 104 mg/dL — ABNORMAL HIGH (ref 70–99)
Potassium: 4.1 mmol/L (ref 3.5–5.2)
Sodium: 143 mmol/L (ref 134–144)
Total Protein: 6.6 g/dL (ref 6.0–8.5)
eGFR: 84 mL/min/{1.73_m2} (ref >59)

## 2024-01-14 LAB — MICROALBUMIN / CREATININE URINE RATIO
Creatinine, Urine: 194.4 mg/dL
Microalb/Creat Ratio: 8 mg/g{creat} (ref 0–29)
Microalbumin, Urine: 15 ug/mL

## 2024-01-14 LAB — HEMOGLOBIN A1C
Est. average glucose Bld gHb Est-mCnc: 123 mg/dL
Hgb A1c MFr Bld: 5.9 % — ABNORMAL HIGH (ref 4.8–5.6)

## 2024-01-14 NOTE — Progress Notes (Signed)
 Date:  01/14/2024   Name:  Sheri Green   DOB:  1946/06/20   MRN:  [id number]   Chief Complaint: Medical Management of Chronic Issues (Patient presents today to follow up on her diabetes and HTN. She is doing well and taking her medications as directed. She did stop the atenolol  because it was making her dizzy.)  Diabetes She presents for her follow-up diabetic visit. She has type 2 diabetes mellitus. Pertinent negatives for hypoglycemia include no dizziness, headaches, nervousness/anxiousness or tremors. Pertinent negatives for diabetes include no chest pain, no fatigue, no polydipsia, no polyuria and no weakness. Current diabetic treatments: MTF and Ozempic . She is compliant with treatment all of the time.  Hypertension This is a chronic problem. The problem is controlled. Pertinent negatives include no chest pain, headaches, palpitations or shortness of breath. Treatments tried: was on atenolol  but stopped it.  Hyperlipidemia This is a chronic problem. The problem is controlled. Pertinent negatives include no chest pain, myalgias or shortness of breath. Current antihyperlipidemic treatment includes statins. The current treatment provides significant improvement of lipids.    Review of Systems  Constitutional:  Negative for appetite change, fatigue, fever and unexpected weight change.  HENT:  Negative for tinnitus and trouble swallowing.   Eyes:  Negative for visual disturbance.  Respiratory:  Negative for cough, chest tightness, shortness of breath and wheezing.   Cardiovascular:  Negative for chest pain, palpitations and leg swelling.  Gastrointestinal:  Negative for abdominal pain, constipation and diarrhea.  Endocrine: Negative for polydipsia and polyuria.  Genitourinary:  Negative for dysuria and hematuria.  Musculoskeletal:  Negative for arthralgias and myalgias.  Neurological:  Negative for dizziness, tremors, weakness, light-headedness, numbness and headaches.   Psychiatric/Behavioral:  Negative for dysphoric mood and sleep disturbance. The patient is not nervous/anxious.      Lab Results  Component Value Date   NA 141 08/16/2023   K 4.2 08/16/2023   CO2 24 08/16/2023   GLUCOSE 160 (H) 08/16/2023   BUN 19 08/16/2023   CREATININE 0.89 08/16/2023   CALCIUM  9.3 08/16/2023   EGFR 68 08/16/2023   Lab Results  Component Value Date   CHOL 173 08/16/2023   HDL 71 08/16/2023   LDLCALC 81 08/16/2023   TRIG 118 08/16/2023   CHOLHDL 2.4 08/16/2023   Lab Results  Component Value Date   TSH 3.020 04/28/2022   Lab Results  Component Value Date   HGBA1C 7.1 (H) 08/16/2023   Lab Results  Component Value Date   WBC 5.3 08/16/2023   HGB 12.4 08/16/2023   HCT 38.9 08/16/2023   MCV 86 08/16/2023   PLT 270 08/16/2023   Lab Results  Component Value Date   ALT 19 08/16/2023   AST 19 08/16/2023   ALKPHOS 65 08/16/2023   BILITOT 0.5 08/16/2023   No results found for: "25OHVITD2", "25OHVITD3", "VD25OH"   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   Spinal stenosis,  lumbar region, 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   Hyperlipidemia associated with type 2 diabetes mellitus (HCC) 08/17/1998   Essential hypertension 08/17/1988   Insomnia 08/17/1986    Allergies  Allergen Reactions   Diltiazem Hives    Past Surgical History:   Procedure Laterality Date   ABDOMINAL HYSTERECTOMY  1994   BARIATRIC SURGERY  2015   gastric sleeve   ESOPHAGOGASTRODUODENOSCOPY (EGD) WITH PROPOFOL  N/A 09/07/2022   Procedure: ESOPHAGOGASTRODUODENOSCOPY (EGD) WITH PROPOFOL ;  Surgeon: Marnee Sink, MD;  Location: [facility];  Service: Endoscopy;  Laterality: N/A;   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   UPPER GI ENDOSCOPY  2023    Social History   Tobacco Use   Smoking status: Never   Smokeless tobacco: Never  Substance Use Topics   Alcohol use: Yes    Comment: rare   Drug use: No     Medication list has been reviewed and updated.  Current Meds  Medication Sig   atorvastatin  (LIPITOR) 40 MG tablet TAKE 1 TABLET BY MOUTH DAILY   buPROPion  (WELLBUTRIN  XL) 300 MG 24 hr tablet TAKE 1 TABLET BY MOUTH DAILY   escitalopram  (LEXAPRO ) 10 MG tablet TAKE 1 TABLET BY MOUTH DAILY   latanoprost (XALATAN) 0.005 % ophthalmic solution 1 drop at bedtime.   meloxicam  (MOBIC ) 7.5 MG tablet Take 1 tablet (7.5 mg total) by mouth daily as needed for pain. Take with food.   metFORMIN  (GLUCOPHAGE ) 500 MG tablet TAKE 1 TABLET BY MOUTH TWICE  DAILY WITH MEALS   pantoprazole (PROTONIX) 20 MG tablet Take 20 mg by mouth daily.   QUEtiapine  (SEROQUEL ) 25 MG tablet TAKE 1 TABLET BY MOUTH AT  BEDTIME AS NEEDED   Semaglutide ,0.25 or 0.5MG /DOS, (OZEMPIC , 0.25 OR 0.5 MG/DOSE,) 2 MG/3ML SOPN INJECT SUBCUTANEOUSLY 0.5 MG  EVERY WEEK   traZODone  (DESYREL ) 50 MG tablet Take 1 tablet (50 mg total) by mouth at bedtime.   UNABLE TO FIND Med Name: Nature's Patriciaann Boone and Fruit Supplement       09/01/2023    3:06 PM 05/06/2023    9:59 AM 12/22/2022    3:12 PM 08/25/2022    2:29 PM  GAD 7 : Generalized Anxiety Score  Nervous, Anxious, on Edge 2 1 0 1  Control/stop worrying 1 1 0 2  Worry too much - different things 1 1 0 2  Trouble relaxing 3 1 3  0  Restless 1 1 0 0  Easily annoyed or irritable 1 0 0 0  Afraid - awful might happen 0 0 0 0  Total  GAD 7 Score 9 5 3 5   Anxiety Difficulty Very difficult Somewhat difficult Not difficult at all Not difficult at all       01/14/2024    4:02 PM 09/01/2023    3:06 PM 05/06/2023    9:59 AM  Depression screen PHQ 2/9  Decreased Interest 0 2 1  Down, Depressed, Hopeless 0 0 1  PHQ - 2 Score 0 2 2  Altered sleeping 0 1 0  Tired, decreased energy 0 3 3  Change in appetite 0 3 1  Feeling bad or failure about yourself  0 0 0  Trouble concentrating 0 1 0  Moving slowly or fidgety/restless 0 1 0  Suicidal thoughts 0 0 0  PHQ-9 Score 0 11 6  Difficult doing work/chores Not difficult at all Not difficult at all Not difficult at all    BP Readings from Last 3 Encounters:  01/14/24 120/76  09/01/23 114/62  05/06/23 102/62  Physical Exam Vitals and nursing note reviewed.  Constitutional:      General: She is not in acute distress.    Appearance: She is well-developed.  HENT:     Head: Normocephalic and atraumatic.  Cardiovascular:     Rate and Rhythm: Normal rate and regular rhythm.     Heart sounds: No murmur heard. Pulmonary:     Effort: Pulmonary effort is normal. No respiratory distress.     Breath sounds: No wheezing or rhonchi.  Musculoskeletal:     Cervical back: Normal range of motion.     Right lower leg: No edema.     Left lower leg: No edema.  Skin:    General: Skin is warm and dry.     Findings: No rash.  Neurological:     General: No focal deficit present.     Mental Status: She is alert and oriented to person, place, and time.  Psychiatric:        Mood and Affect: Mood normal.        Behavior: Behavior normal.     Wt Readings from Last 3 Encounters:  01/14/24 183 lb (83 kg)  09/01/23 191 lb (86.6 kg)  05/06/23 183 lb 3.2 oz (83.1 kg)    BP 120/76   Pulse 99   Ht 5\' 7"  (1.702 m)   Wt 183 lb (83 kg)   SpO2 97%   BMI 28.66 kg/m   Assessment and Plan:  Problem List Items Addressed This Visit       Unprioritized   Type II diabetes mellitus with  complication (HCC) - Primary (Chronic)   Blood sugars have been stable.  No recent hypoglycemic events requiring assistance. Currently medications are MTF with Ozempic . Lab Results  Component Value Date   HGBA1C 7.1 (H) 08/16/2023   Last visit Ozempic  was started. A1C drawn yesterday. Weight is down 8 lbs. She is reminded to schedule her eye exam.       Hyperlipidemia associated with type 2 diabetes mellitus (HCC) (Chronic)   LDL is  Lab Results  Component Value Date   LDLCALC 81 08/16/2023   Current regimen is atorvastatin .  No medication side effects noted. Goal LDL is <70.       Essential hypertension (Chronic)   Blood pressure is well controlled.  Current medications are none - she stopped atenolol  due to lightheadedness. Will continue to monitor and to limit dietary sodium.       Other Visit Diagnoses       Encounter for screening mammogram for breast cancer       Relevant Orders   MM 3D SCREENING MAMMOGRAM BILATERAL BREAST     Long term current use of oral hypoglycemic drug           Return in about 4 months (around 05/16/2024) for HTN, DM.    Sheron Dixons, MD [facility] [place]

## 2024-01-14 NOTE — Assessment & Plan Note (Signed)
 Blood pressure is well controlled.  Current medications are none - she stopped atenolol  due to lightheadedness. Will continue to monitor and to limit dietary sodium.

## 2024-01-14 NOTE — Patient Instructions (Addendum)
 Schedule your diabetic eye exam with [facility].  Call [facility] to schedule your mammogram at [phone].  Dr. Barnetta Liberty - taking any patients who want to see her.

## 2024-01-14 NOTE — Assessment & Plan Note (Addendum)
 Blood sugars have been stable.  No recent hypoglycemic events requiring assistance. Currently medications are MTF with Ozempic . Lab Results  Component Value Date   HGBA1C 7.1 (H) 08/16/2023   Last visit Ozempic  was started. A1C drawn yesterday. Weight is down 8 lbs. She is reminded to schedule her eye exam.

## 2024-01-14 NOTE — Assessment & Plan Note (Signed)
 LDL is  Lab Results  Component Value Date   LDLCALC 81 08/16/2023   Current regimen is atorvastatin .  No medication side effects noted. Goal LDL is <70.

## 2024-01-16 ENCOUNTER — Ambulatory Visit: Payer: Self-pay | Admitting: Internal Medicine

## 2024-01-19 ENCOUNTER — Other Ambulatory Visit: Payer: Self-pay | Admitting: Internal Medicine

## 2024-01-19 DIAGNOSIS — F5101 Primary insomnia: Secondary | ICD-10-CM

## 2024-01-20 NOTE — Telephone Encounter (Signed)
 Requested Prescriptions  Pending Prescriptions Disp Refills   traZODone  (DESYREL ) 50 MG tablet [Pharmacy Med Name: traZODone  HCl 50 MG Oral Tablet] 90 tablet 1    Sig: TAKE 1 TABLET BY MOUTH AT  BEDTIME     Psychiatry: Antidepressants - Serotonin Modulator Passed - 01/20/2024  5:40 PM      Passed - Completed PHQ-2 or PHQ-9 in the last 360 days      Passed - Valid encounter within last 6 months    Recent Outpatient Visits           6 days ago Type II diabetes mellitus with complication [facility])   [place] [facility] at [facility], Chales [place], MD       Future Appointments             In 3 months Gala Jubilee, Chales [place], MD [facility] Primary [facility] Medicine at [facility], [facility]

## 2024-01-28 ENCOUNTER — Other Ambulatory Visit: Payer: Self-pay

## 2024-01-28 MED ORDER — PANTOPRAZOLE SODIUM 20 MG PO TBEC: 20.0000 mg | DELAYED_RELEASE_TABLET | Freq: Every day | ORAL | 1 refills | Status: DC

## 2024-02-14 ENCOUNTER — Other Ambulatory Visit: Payer: Self-pay | Admitting: Internal Medicine

## 2024-02-14 DIAGNOSIS — F324 Major depressive disorder, single episode, in partial remission: Secondary | ICD-10-CM

## 2024-02-15 ENCOUNTER — Other Ambulatory Visit: Payer: Self-pay | Admitting: Internal Medicine

## 2024-02-15 ENCOUNTER — Telehealth: Payer: Self-pay

## 2024-02-15 DIAGNOSIS — E118 Type 2 diabetes mellitus with unspecified complications: Secondary | ICD-10-CM

## 2024-02-15 NOTE — Telephone Encounter (Signed)
 Copied from CRM [id number]. Topic: Clinical - Medication Question >> Feb 15, 2024 10:35 AM Wess RAMAN wrote: Reason for CRM: Patient would like the dosage increased on pantoprazole  (PROTONIX ) 20 MG tablet  Callback #: [id number]  Preferred Pharmacy: [id number], [place] - [address] AT [facility] OF [address] [place] [id number]: [phone] Fax: [phone]: Not open 24 hours

## 2024-02-15 NOTE — Telephone Encounter (Signed)
 Requested Prescriptions  Pending Prescriptions Disp Refills   escitalopram  (LEXAPRO ) 10 MG tablet [Pharmacy Med Name: Escitalopram  Oxalate 10 MG Oral Tablet] 90 tablet 1    Sig: TAKE 1 TABLET BY MOUTH DAILY     Psychiatry:  Antidepressants - SSRI Passed - 02/15/2024  3:49 PM      Passed - Completed PHQ-2 or PHQ-9 in the last 360 days      Passed - Valid encounter within last 6 months    Recent Outpatient Visits           1 month ago Type II diabetes mellitus with complication [facility])   [place] [facility] at [facility], Leita DEL, MD       Future Appointments             Tomorrow Justus Leita DEL, MD [facility] Primary [facility] & [facility] Medicine at [facility], [facility]   In 2 months Justus, Leita DEL, MD [facility] & Sports Medicine at [facility], [facility]

## 2024-02-15 NOTE — Telephone Encounter (Signed)
 done

## 2024-02-16 ENCOUNTER — Ambulatory Visit (INDEPENDENT_AMBULATORY_CARE_PROVIDER_SITE_OTHER): Admitting: Internal Medicine

## 2024-02-16 ENCOUNTER — Encounter: Payer: Self-pay | Admitting: Internal Medicine

## 2024-02-16 VITALS — BP 122/78 | HR 98 | Ht 67.0 in | Wt 172.0 lb

## 2024-02-16 DIAGNOSIS — K22719 Barrett's esophagus with dysplasia, unspecified: Secondary | ICD-10-CM

## 2024-02-16 DIAGNOSIS — Z7985 Long-term (current) use of injectable non-insulin antidiabetic drugs: Secondary | ICD-10-CM | POA: Diagnosis not present

## 2024-02-16 DIAGNOSIS — E118 Type 2 diabetes mellitus with unspecified complications: Secondary | ICD-10-CM | POA: Diagnosis not present

## 2024-02-16 MED ORDER — PANTOPRAZOLE SODIUM 40 MG PO TBEC: 40.0000 mg | DELAYED_RELEASE_TABLET | Freq: Every day | ORAL | 1 refills | Status: DC

## 2024-02-16 NOTE — Assessment & Plan Note (Signed)
 Blood sugars have been stable.  No recent hypoglycemic events requiring assistance. Currently medications are Ozempic  0.5 mg.  The 1 mg dose caused some stomach upset. Lab Results  Component Value Date   HGBA1C 5.9 (H) 01/13/2024   Last visit Ozempic  was reduced to 0.5 mg. She has lost another 9 lbs and feels well. Mild abd pain c/w reflux noted - pantoprazole  dose increased.

## 2024-02-16 NOTE — Telephone Encounter (Signed)
 Requested medication (s) are due for refill today:   Yes  Requested medication (s) are on the active medication list:   Yes  Future visit scheduled:   Yes   Last ordered: 12/21/2023 6 ml, 0 refills  Returned because a 1 yr supply is being requested.     Requested Prescriptions  Pending Prescriptions Disp Refills   OZEMPIC , 0.25 OR 0.5 MG/DOSE, 2 MG/3ML SOPN [Pharmacy Med Name: OZEMPIC   0.25MG  0.5MG  SOLUTION PEN-INJECTOR  PEN DOSE] 6 mL 5    Sig: INJECT SUBCUTANEOUSLY 0.5 MG  EVERY WEEK     Endocrinology:  Diabetes - GLP-1 Receptor Agonists - semaglutide  Failed - 02/16/2024  3:22 PM      Failed - HBA1C in normal range and within 180 days    Hemoglobin A1C  Date Value Ref Range Status  12/19/2021 5.8  Final   Hgb A1c MFr Bld  Date Value Ref Range Status  01/13/2024 5.9 (H) 4.8 - 5.6 % Final    Comment:             Prediabetes: 5.7 - 6.4          Diabetes: >6.4          Glycemic control for adults with diabetes: <7.0          Passed - Cr in normal range and within 360 days    Creatinine, Ser  Date Value Ref Range Status  01/13/2024 0.74 0.57 - 1.00 mg/dL Final         Passed - Valid encounter within last 6 months    Recent Outpatient Visits           Today Barrett's esophagus with dysplasia   [place] [facility] at [facility], Leita DEL, MD   1 month ago Type II diabetes mellitus with complication [facility])   [place] [facility] at [facility], Leita DEL, MD       Future Appointments             In 2 months Justus Leita DEL, MD [facility] Primary [facility] & [facility] Medicine at [facility], [facility]

## 2024-02-16 NOTE — Progress Notes (Signed)
 Date:  02/16/2024   Name:  Sheri Green   DOB:  Jul 13, 1946   MRN:  [id number]   Chief Complaint: Gastroesophageal Reflux (Wants to increase Pantoprazole  to 40 mg instead of 20 mg. )  Gastroesophageal Reflux She complains of abdominal pain, heartburn, a sore throat and water  brash. She reports no chest pain, no coughing, no dysphagia, no nausea or no wheezing. This is a recurrent problem. The problem occurs frequently. The heartburn duration is several minutes. The heartburn is located in the substernum. The heartburn wakes her from sleep. The heartburn does not limit her activity. The heartburn changes with position. The symptoms are aggravated by lying down. Pertinent negatives include no anemia, fatigue or muscle weakness. Risk factors include Barrett's esophagus. She has tried a PPI for the symptoms. The treatment provided no relief (on 20 mg dose).    Review of Systems  Constitutional:  Negative for fatigue.  HENT:  Positive for sore throat. Negative for trouble swallowing.   Respiratory:  Negative for cough, shortness of breath and wheezing.   Cardiovascular:  Negative for chest pain.  Gastrointestinal:  Positive for abdominal pain and heartburn. Negative for constipation, diarrhea, dysphagia, nausea and vomiting.  Musculoskeletal:  Negative for muscle weakness.     Lab Results  Component Value Date   NA 143 01/13/2024   K 4.1 01/13/2024   CO2 20 01/13/2024   GLUCOSE 104 (H) 01/13/2024   BUN 16 01/13/2024   CREATININE 0.74 01/13/2024   CALCIUM  9.4 01/13/2024   EGFR 84 01/13/2024   Lab Results  Component Value Date   CHOL 156 01/13/2024   HDL 73 01/13/2024   LDLCALC 61 01/13/2024   TRIG 126 01/13/2024   CHOLHDL 2.1 01/13/2024   Lab Results  Component Value Date   TSH 3.020 04/28/2022   Lab Results  Component Value Date   HGBA1C 5.9 (H) 01/13/2024   Lab Results  Component Value Date   WBC 4.2 01/13/2024   HGB 12.0 01/13/2024   HCT 38.2 01/13/2024   MCV 87  01/13/2024   PLT 268 01/13/2024   Lab Results  Component Value Date   ALT 18 01/13/2024   AST 24 01/13/2024   ALKPHOS 59 01/13/2024   BILITOT 0.4 01/13/2024   No results found for: MARIEN BOLLS, VD25OH   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   Spinal stenosis, lumbar region, 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   Hyperlipidemia associated with type 2 diabetes mellitus (HCC) 08/17/1998   Essential hypertension 08/17/1988   Insomnia 08/17/1986    Allergies  Allergen Reactions   Diltiazem Hives    Past Surgical History:  Procedure Laterality Date   ABDOMINAL HYSTERECTOMY  1994   BARIATRIC SURGERY  2015   gastric sleeve   ESOPHAGOGASTRODUODENOSCOPY (EGD) WITH PROPOFOL  N/A 09/07/2022   Procedure: ESOPHAGOGASTRODUODENOSCOPY (EGD) WITH PROPOFOL ;  Surgeon: Jinny Carmine, MD;  Location: [facility];  Service: Endoscopy;  Laterality: N/A;  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   UPPER GI ENDOSCOPY  2023    Social History   Tobacco Use   Smoking status: Never   Smokeless tobacco: Never  Substance Use Topics   Alcohol use: Yes    Comment: rare   Drug use: No     Medication list has been reviewed and updated.  Current Meds  Medication Sig   atorvastatin  (LIPITOR) 40 MG tablet TAKE 1 TABLET BY MOUTH DAILY   buPROPion  (WELLBUTRIN  XL) 300 MG  24 hr tablet TAKE 1 TABLET BY MOUTH DAILY   escitalopram  (LEXAPRO ) 10 MG tablet TAKE 1 TABLET BY MOUTH DAILY   latanoprost (XALATAN) 0.005 % ophthalmic solution 1 drop at bedtime.   meloxicam  (MOBIC ) 7.5 MG tablet Take 1 tablet (7.5 mg total) by mouth daily as needed for pain. Take with food.   metFORMIN  (GLUCOPHAGE ) 500 MG tablet TAKE 1 TABLET BY MOUTH TWICE  DAILY WITH MEALS   pantoprazole  (PROTONIX ) 40 MG tablet Take 1 tablet (40 mg total) by mouth daily.   QUEtiapine  (SEROQUEL ) 25 MG tablet TAKE 1 TABLET BY MOUTH AT  BEDTIME AS NEEDED   Semaglutide ,0.25 or 0.5MG /DOS, (OZEMPIC , 0.25 OR 0.5 MG/DOSE,) 2 MG/3ML SOPN INJECT SUBCUTANEOUSLY 0.5 MG  EVERY WEEK   UNABLE TO FIND Med Name: Nature's Bounty Vegitable and Fruit Supplement   [DISCONTINUED] pantoprazole  (PROTONIX ) 20 MG tablet Take 1 tablet (20 mg total) by mouth daily.   [DISCONTINUED] traZODone  (DESYREL ) 50 MG tablet TAKE 1 TABLET BY MOUTH AT  BEDTIME       02/16/2024    2:30 PM 09/01/2023    3:06 PM 05/06/2023    9:59 AM 12/22/2022    3:12 PM  GAD 7 : Generalized Anxiety Score  Nervous, Anxious, on Edge 2 2 1  0  Control/stop worrying 0 1 1 0  Worry too much - different things 1 1 1  0  Trouble relaxing 2 3 1 3   Restless 2 1 1  0  Easily annoyed or irritable 0 1 0 0  Afraid - awful might happen 1 0 0 0  Total GAD 7 Score 8 9 5 3   Anxiety Difficulty Somewhat difficult Very difficult Somewhat difficult Not difficult at all       02/16/2024    2:30 PM 01/14/2024    4:02 PM 09/01/2023    3:06 PM  Depression screen PHQ 2/9  Decreased Interest 0 0 2  Down, Depressed, Hopeless 2 0 0  PHQ - 2 Score 2 0 2  Altered sleeping 2 0 1  Tired, decreased energy 2 0 3  Change in appetite 0 0 3  Feeling bad or failure about yourself  0 0 0  Trouble concentrating 0 0 1  Moving slowly or fidgety/restless 2 0 1  Suicidal thoughts 0 0 0  PHQ-9 Score 8 0 11  Difficult doing work/chores Not difficult at all Not difficult at all Not difficult at all     BP Readings from Last 3 Encounters:  02/16/24 122/78  01/14/24 120/76  09/01/23 114/62    Physical Exam Vitals and nursing note reviewed.  Constitutional:      General: She is not in acute distress.    Appearance: Normal appearance. She is well-developed.  HENT:     Head: Normocephalic and atraumatic.  Cardiovascular:     Rate and Rhythm: Normal rate and regular rhythm.     Heart sounds: No murmur heard. Pulmonary:     Effort: Pulmonary effort is normal. No respiratory distress.  Breath sounds: No wheezing or rhonchi.  Abdominal:     General: Abdomen is flat. Bowel sounds are normal.     Palpations: Abdomen is soft.     Tenderness: There is no abdominal tenderness.  Skin:    General: Skin is warm and dry.     Findings: No rash.  Neurological:     Mental Status: She is alert and oriented to person, place, and time.  Psychiatric:        Mood and Affect: Mood normal.        Behavior: Behavior normal.     Wt Readings from Last 3 Encounters:  02/16/24 172 lb (78 kg)  01/14/24 183 lb (83 kg)  09/01/23 191 lb (86.6 kg)    BP 122/78   Pulse 98   Ht 5' 7 (1.702 m)   Wt 172 lb (78 kg)   SpO2 100%   BMI 26.94 kg/m   Assessment and Plan:  Problem List Items Addressed This Visit       Unprioritized   Type II diabetes mellitus with complication (HCC) (Chronic)   Blood sugars have been stable.  No recent hypoglycemic events requiring assistance. Currently medications are Ozempic  0.5 mg.  The 1 mg dose caused some stomach upset. Lab Results  Component Value Date   HGBA1C 5.9 (H) 01/13/2024   Last visit Ozempic  was reduced to 0.5 mg. She has lost another 9 lbs and feels well. Mild abd pain c/w reflux noted - pantoprazole  dose increased.       Barrett's esophagus - Primary (Chronic)   Reflux symptoms are not controlled on Pantoprazole  20 mg.  Symptoms could be worsened by Ozempic  therapy. Patient denies red flag symptoms - no melena, weight loss,  dysphagia. She has hx of Barrett's esophagus with last EGD 10/2021 Dr. Jinny Will increase dose to 40 mg daily.       Relevant Medications   pantoprazole  (PROTONIX ) 40 MG tablet   Other Visit Diagnoses       Long-term current use of injectable noninsulin antidiabetic medication           No follow-ups on file.    Leita HILARIO Adie, MD [facility] Medicine [place]

## 2024-02-16 NOTE — Assessment & Plan Note (Addendum)
 Reflux symptoms are not controlled on Pantoprazole  20 mg.  Symptoms could be worsened by Ozempic  therapy. Patient denies red flag symptoms - no melena, weight loss, dysphagia. She has hx of Barrett's esophagus with last EGD 10/2021 Dr. Jinny Will increase dose to 40 mg daily.

## 2024-02-29 ENCOUNTER — Other Ambulatory Visit: Payer: Self-pay | Admitting: Internal Medicine

## 2024-02-29 DIAGNOSIS — F5101 Primary insomnia: Secondary | ICD-10-CM

## 2024-03-02 NOTE — Telephone Encounter (Signed)
 Requested medication (s) are due for refill today: yes  Requested medication (s) are on the active medication list: yes  Last refill:  04/20/23  Future visit scheduled: yes  Notes to clinic:  Unable to refill per protocol, cannot delegate.      Requested Prescriptions  Pending Prescriptions Disp Refills   QUEtiapine  (SEROQUEL ) 25 MG tablet [Pharmacy Med Name: QUEtiapine  Fumarate 25 MG Oral Tablet] 90 tablet 3    Sig: TAKE 1 TABLET BY MOUTH AT  BEDTIME AS NEEDED     Not Delegated - Psychiatry:  Antipsychotics - Second Generation (Atypical) - quetiapine  Failed - 03/02/2024 12:08 PM      Failed - This refill cannot be delegated      Failed - TSH in normal range and within 360 days    TSH  Date Value Ref Range Status  04/28/2022 3.020 0.450 - 4.500 uIU/mL Final         Failed - Lipid Panel in normal range within the last 12 months    Cholesterol, Total  Date Value Ref Range Status  01/13/2024 156 100 - 199 mg/dL Final   LDL Chol Calc (NIH)  Date Value Ref Range Status  01/13/2024 61 0 - 99 mg/dL Final   HDL  Date Value Ref Range Status  01/13/2024 73 >39 mg/dL Final   Triglycerides  Date Value Ref Range Status  01/13/2024 126 0 - 149 mg/dL Final         Passed - Completed PHQ-2 or PHQ-9 in the last 360 days      Passed - Last BP in normal range    BP Readings from Last 1 Encounters:  02/16/24 122/78         Passed - Last Heart Rate in normal range    Pulse Readings from Last 1 Encounters:  02/16/24 98         Passed - Valid encounter within last 6 months    Recent Outpatient Visits           2 weeks ago Barrett's esophagus with dysplasia   [place] [facility] at [facility], Leita DEL, MD   1 month ago Type II diabetes mellitus with complication [facility])   [place] [facility] at [facility], Leita DEL, MD       Future Appointments             In 2 months Justus Leita DEL, MD [facility] Primary Care & Sports Medicine at [facility]            Passed - CBC within normal limits and completed in the last 12 months    WBC  Date Value Ref Range Status  01/13/2024 4.2 3.4 - 10.8 x10E3/uL Final   RBC  Date Value Ref Range Status  01/13/2024 4.37 3.77 - 5.28 x10E6/uL Final  12/19/2021 4.42 3.87 - 5.11 Final   Hemoglobin  Date Value Ref Range Status  01/13/2024 12.0 11.1 - 15.9 g/dL Final   Hematocrit  Date Value Ref Range Status  01/13/2024 38.2 34.0 - 46.6 % Final   MCHC  Date Value Ref Range Status  01/13/2024 31.4 (L) 31.5 - 35.7 g/dL Final   [facility]  Date Value Ref Range Status  01/13/2024 27.5 26.6 - 33.0 pg Final   MCV  Date Value Ref Range Status  01/13/2024 87 79 - 97 fL Final   No results found for: PLTCOUNTKUC, LABPLAT, POCPLA RDW  Date Value Ref Range Status  01/13/2024 12.9 11.7 - 15.4 % Final         Passed - CMP within normal limits and completed in the last 12 months    Albumin  Date Value Ref Range Status  01/13/2024 4.7 3.8 - 4.8 g/dL Final   Alkaline Phosphatase  Date Value Ref Range Status  01/13/2024 59 44 - 121 IU/L Final   ALT  Date Value Ref Range Status  01/13/2024 18 0 - 32 IU/L Final   AST  Date Value Ref Range Status  01/13/2024 24 0 - 40 IU/L Final   BUN  Date Value Ref Range Status  01/13/2024 16 8 - 27 mg/dL Final   Calcium   Date Value Ref Range Status  01/13/2024 9.4 8.7 - 10.3 mg/dL Final   CO2  Date Value Ref Range Status  01/13/2024 20 20 - 29 mmol/L Final   Creatinine, Ser  Date Value Ref Range Status  01/13/2024 0.74 0.57 - 1.00 mg/dL Final   Glucose  Date Value Ref Range Status  01/13/2024 104 (H) 70 - 99 mg/dL Final  94/94/7976 879  Final   Glucose-Capillary  Date Value Ref Range Status  09/07/2022 112 (H) 70 - 99 mg/dL Final    Comment:    Glucose reference range applies only to samples taken after fasting for at least 8 hours.   Potassium  Date Value Ref Range  Status  01/13/2024 4.1 3.5 - 5.2 mmol/L Final   Sodium  Date Value Ref Range Status  01/13/2024 143 134 - 144 mmol/L Final   Bilirubin Total  Date Value Ref Range Status  01/13/2024 0.4 0.0 - 1.2 mg/dL Final   Total Protein  Date Value Ref Range Status  01/13/2024 6.6 6.0 - 8.5 g/dL Final   eGFR  Date Value Ref Range Status  01/13/2024 84 >59 mL/min/1.73 Final

## 2024-03-02 NOTE — Telephone Encounter (Signed)
Please review medication refill  request

## 2024-03-15 ENCOUNTER — Other Ambulatory Visit: Payer: Self-pay | Admitting: Internal Medicine

## 2024-03-16 NOTE — Telephone Encounter (Signed)
 Requested Prescriptions  Pending Prescriptions Disp Refills   buPROPion  (WELLBUTRIN  XL) 300 MG 24 hr tablet [Pharmacy Med Name: buPROPion  HCl ER (XL) 300 MG Oral Tablet Extended Release 24 Hour] 90 tablet 0    Sig: TAKE 1 TABLET BY MOUTH DAILY     Psychiatry: Antidepressants - bupropion  Passed - 03/16/2024  9:04 AM      Passed - Cr in normal range and within 360 days    Creatinine, Ser  Date Value Ref Range Status  01/13/2024 0.74 0.57 - 1.00 mg/dL Final         Passed - AST in normal range and within 360 days    AST  Date Value Ref Range Status  01/13/2024 24 0 - 40 IU/L Final         Passed - ALT in normal range and within 360 days    ALT  Date Value Ref Range Status  01/13/2024 18 0 - 32 IU/L Final         Passed - Completed PHQ-2 or PHQ-9 in the last 360 days      Passed - Last BP in normal range    BP Readings from Last 1 Encounters:  02/16/24 122/78         Passed - Valid encounter within last 6 months    Recent Outpatient Visits           4 weeks ago Barrett's esophagus with dysplasia   [place] [facility] at [facility], Leita DEL, MD   2 months ago Type II diabetes mellitus with complication [facility])   [place] [facility] at [facility], Leita DEL, MD       Future Appointments             In 1 month Justus, Leita DEL, MD [facility] at [facility], [facility]             metFORMIN  (GLUCOPHAGE ) 500 MG tablet [Pharmacy Med Name: metFORMIN  HCl 500 MG Oral Tablet] 180 tablet 0    Sig: TAKE 1 TABLET BY MOUTH TWICE  DAILY WITH MEALS     Endocrinology:  Diabetes - Biguanides Failed - 03/16/2024  9:04 AM      Failed - B12 Level in normal range and within 720 days    No results found for: VITAMINB12       Passed - Cr in normal range and within 360 days    Creatinine, Ser  Date Value Ref Range Status  01/13/2024 0.74 0.57 - 1.00 mg/dL Final         Passed -  HBA1C is between 0 and 7.9 and within 180 days    Hemoglobin A1C  Date Value Ref Range Status  12/19/2021 5.8  Final   Hgb A1c MFr Bld  Date Value Ref Range Status  01/13/2024 5.9 (H) 4.8 - 5.6 % Final    Comment:             Prediabetes: 5.7 - 6.4          Diabetes: >6.4          Glycemic control for adults with diabetes: <7.0          Passed - eGFR in normal range and within 360 days    eGFR  Date Value Ref Range Status  01/13/2024 84 >59 mL/min/1.73 Final         Passed - Valid encounter within last  6 months    Recent Outpatient Visits           4 weeks ago Barrett's esophagus with dysplasia   [place] [facility] at [facility], Leita DEL, MD   2 months ago Type II diabetes mellitus with complication [facility])   [place] [facility] at [facility], Leita DEL, MD       Future Appointments             In 1 month Justus Leita DEL, MD [facility] at [facility], [facility]            Passed - CBC within normal limits and completed in the last 12 months    WBC  Date Value Ref Range Status  01/13/2024 4.2 3.4 - 10.8 x10E3/uL Final   RBC  Date Value Ref Range Status  01/13/2024 4.37 3.77 - 5.28 x10E6/uL Final  12/19/2021 4.42 3.87 - 5.11 Final   Hemoglobin  Date Value Ref Range Status  01/13/2024 12.0 11.1 - 15.9 g/dL Final   Hematocrit  Date Value Ref Range Status  01/13/2024 38.2 34.0 - 46.6 % Final   MCHC  Date Value Ref Range Status  01/13/2024 31.4 (L) 31.5 - 35.7 g/dL Final   [facility]  Date Value Ref Range Status  01/13/2024 27.5 26.6 - 33.0 pg Final   MCV  Date Value Ref Range Status  01/13/2024 87 79 - 97 fL Final   No results found for: PLTCOUNTKUC, LABPLAT, POCPLA RDW  Date Value Ref Range Status  01/13/2024 12.9 11.7 - 15.4 % Final

## 2024-03-17 ENCOUNTER — Other Ambulatory Visit: Payer: Self-pay | Admitting: Internal Medicine

## 2024-03-17 DIAGNOSIS — E118 Type 2 diabetes mellitus with unspecified complications: Secondary | ICD-10-CM

## 2024-03-17 DIAGNOSIS — E1169 Type 2 diabetes mellitus with other specified complication: Secondary | ICD-10-CM

## 2024-03-20 NOTE — Telephone Encounter (Signed)
 Requested Prescriptions  Pending Prescriptions Disp Refills   Semaglutide ,0.25 or 0.5MG /DOS, (OZEMPIC , 0.25 OR 0.5 MG/DOSE,) 2 MG/3ML SOPN [Pharmacy Med Name: OZEMPIC   0.25MG  0.5MG  SOLUTION PEN-INJECTOR  PEN DOSE] 6 mL 1    Sig: INJECT SUBCUTANEOUSLY 0.5 MG  EVERY WEEK     Endocrinology:  Diabetes - GLP-1 Receptor Agonists - semaglutide  Failed - 03/20/2024 11:05 AM      Failed - HBA1C in normal range and within 180 days    Hemoglobin A1C  Date Value Ref Range Status  12/19/2021 5.8  Final   Hgb A1c MFr Bld  Date Value Ref Range Status  01/13/2024 5.9 (H) 4.8 - 5.6 % Final    Comment:             Prediabetes: 5.7 - 6.4          Diabetes: >6.4          Glycemic control for adults with diabetes: <7.0          Passed - Cr in normal range and within 360 days    Creatinine, Ser  Date Value Ref Range Status  01/13/2024 0.74 0.57 - 1.00 mg/dL Final         Passed - Valid encounter within last 6 months    Recent Outpatient Visits           1 month ago Barrett's esophagus with dysplasia   [place] [facility] at [facility], Leita DEL, MD   2 months ago Type II diabetes mellitus with complication [facility])   [place] [facility] at [facility], Leita DEL, MD       Future Appointments             In 1 month Justus Leita DEL, MD [facility] at [facility], [facility]             atorvastatin  (LIPITOR) 40 MG tablet [Pharmacy Med Name: Atorvastatin  Calcium  40 MG Oral Tablet] 90 tablet 2    Sig: TAKE 1 TABLET BY MOUTH DAILY     Cardiovascular:  Antilipid - Statins Failed - 03/20/2024 11:05 AM      Failed - Lipid Panel in normal range within the last 12 months    Cholesterol, Total  Date Value Ref Range Status  01/13/2024 156 100 - 199 mg/dL Final   LDL Chol Calc (NIH)  Date Value Ref Range Status  01/13/2024 61 0 - 99 mg/dL Final   HDL  Date Value Ref Range Status  01/13/2024 73  >39 mg/dL Final   Triglycerides  Date Value Ref Range Status  01/13/2024 126 0 - 149 mg/dL Final         Passed - Patient is not pregnant      Passed - Valid encounter within last 12 months    Recent Outpatient Visits           1 month ago Barrett's esophagus with dysplasia   [place] [facility] at [facility], Leita DEL, MD   2 months ago Type II diabetes mellitus with complication [facility])   [place] [facility] at [facility], Leita DEL, MD       Future Appointments             In 1 month Justus, Leita DEL, MD [facility] Medicine at [facility], [facility]

## 2024-03-24 LAB — HM DIABETES EYE EXAM

## 2024-04-24 ENCOUNTER — Other Ambulatory Visit: Payer: Self-pay | Admitting: Internal Medicine

## 2024-04-24 DIAGNOSIS — K22719 Barrett's esophagus with dysplasia, unspecified: Secondary | ICD-10-CM

## 2024-04-24 NOTE — Telephone Encounter (Signed)
 Copied from CRM [id number]. Topic: Clinical - Medication Refill >> Apr 24, 2024 12:58 PM Debby BROCKS wrote: Medication: pantoprazole  (PROTONIX ) 40 MG tablet buPROPion  (WELLBUTRIN  XL) 300 MG 24 hr tablet   Has the patient contacted their pharmacy? Yes (Agent: If no, request that the patient contact the pharmacy for the refill. If patient does not wish to contact the pharmacy document the reason why and proceed with request.) (Agent: If yes, when and what did the pharmacy advise?)  This is the patient's preferred pharmacy:  [facility] [place] [place] - [address] [place] [address] Phone: [phone] Fax: [phone]  Is this the correct pharmacy for this prescription? Yes If no, delete pharmacy and type the correct one.   Has the prescription been filled recently? No  Is the patient out of the medication? No, but patient is going on vacation  Has the patient been seen for an appointment in the last year OR does the patient have an upcoming appointment? Yes  Can we respond through MyChart? Yes  Agent: Please be advised that Rx refills may take up to 3 business days. We ask that you follow-up with your pharmacy.

## 2024-04-25 MED ORDER — BUPROPION HCL ER (XL) 300 MG PO TB24: 300.0000 mg | ORAL_TABLET | Freq: Every day | ORAL | 0 refills | Status: DC

## 2024-04-25 MED ORDER — PANTOPRAZOLE SODIUM 40 MG PO TBEC: 40.0000 mg | DELAYED_RELEASE_TABLET | Freq: Every day | ORAL | 1 refills | Status: DC

## 2024-04-25 NOTE — Telephone Encounter (Signed)
 Sent to [facility] at pt's request.  Requested Prescriptions  Pending Prescriptions Disp Refills   buPROPion  (WELLBUTRIN  XL) 300 MG 24 hr tablet 90 tablet 0    Sig: Take 1 tablet (300 mg total) by mouth daily.     Psychiatry: Antidepressants - bupropion  Passed - 04/25/2024  3:11 PM      Passed - Cr in normal range and within 360 days    Creatinine, Ser  Date Value Ref Range Status  01/13/2024 0.74 0.57 - 1.00 mg/dL Final         Passed - AST in normal range and within 360 days    AST  Date Value Ref Range Status  01/13/2024 24 0 - 40 IU/L Final         Passed - ALT in normal range and within 360 days    ALT  Date Value Ref Range Status  01/13/2024 18 0 - 32 IU/L Final         Passed - Completed PHQ-2 or PHQ-9 in the last 360 days      Passed - Last BP in normal range    BP Readings from Last 1 Encounters:  02/16/24 122/78         Passed - Valid encounter within last 6 months    Recent Outpatient Visits           2 months ago Barrett's esophagus with dysplasia   [place] [facility] at [facility], Leita DEL, MD   3 months ago Type II diabetes mellitus with complication [facility])   [place] [facility] at [facility], Leita DEL, MD       Future Appointments             In 2 weeks Sheri Leita DEL, MD [facility] Primary [facility] Medicine at [facility], [phone] [place]             pantoprazole  (PROTONIX ) 40 MG tablet 90 tablet 1    Sig: Take 1 tablet (40 mg total) by mouth daily.     Gastroenterology: Proton Pump Inhibitors Passed - 04/25/2024  3:11 PM      Passed - Valid encounter within last 12 months    Recent Outpatient Visits           2 months ago Barrett's esophagus with dysplasia   [place] [facility] at [facility], Leita DEL, MD   3 months ago Type II diabetes mellitus with complication [facility])   [place] [facility]  at [facility], Leita DEL, MD       Future Appointments             In 2 weeks Sheri Leita DEL, MD [facility] Primary Care & Sports Medicine at [facility], [phone] [place]

## 2024-05-10 ENCOUNTER — Encounter: Payer: Self-pay | Admitting: Internal Medicine

## 2024-05-10 ENCOUNTER — Ambulatory Visit (INDEPENDENT_AMBULATORY_CARE_PROVIDER_SITE_OTHER): Admitting: Internal Medicine

## 2024-05-10 VITALS — BP 112/60 | HR 96 | Ht 67.0 in | Wt 170.0 lb

## 2024-05-10 DIAGNOSIS — I10 Essential (primary) hypertension: Secondary | ICD-10-CM

## 2024-05-10 DIAGNOSIS — E118 Type 2 diabetes mellitus with unspecified complications: Secondary | ICD-10-CM | POA: Diagnosis not present

## 2024-05-10 DIAGNOSIS — Z7985 Long-term (current) use of injectable non-insulin antidiabetic drugs: Secondary | ICD-10-CM

## 2024-05-10 DIAGNOSIS — F324 Major depressive disorder, single episode, in partial remission: Secondary | ICD-10-CM

## 2024-05-10 MED ORDER — LANCETS MISC. MISC: 1.0000 | Freq: Three times a day (TID) | 0 refills | Status: AC

## 2024-05-10 MED ORDER — BLOOD GLUCOSE TEST VI STRP: 1.0000 | ORAL_STRIP | Freq: Two times a day (BID) | 0 refills | Status: AC

## 2024-05-10 MED ORDER — BLOOD GLUCOSE MONITORING SUPPL DEVI: 1.0000 | Freq: Three times a day (TID) | 0 refills | Status: AC

## 2024-05-10 MED ORDER — LANCET DEVICE MISC: 1.0000 | Freq: Three times a day (TID) | 0 refills | Status: AC

## 2024-05-10 NOTE — Patient Instructions (Signed)
 Call [facility] to schedule your mammogram at [phone].

## 2024-05-10 NOTE — Assessment & Plan Note (Addendum)
 Blood sugars have been stable.  No hypoglycemic events since last visit. Currently medications are Ozempic  0.5 mg weekly and MTF. Last visit medical regimen changes were to reduce ozempic  dose. Lab Results  Component Value Date   HGBA1C 5.9 (H) 01/13/2024  A1C today =  5.5.  will continue Ozempic  0.5 mg weekly.

## 2024-05-10 NOTE — Assessment & Plan Note (Signed)
 Clinically stable on Lexapro , Trazodone , Seroquel  and Bupropion .   No SI or HI on evaluation. Plan to continue same medications for now.

## 2024-05-10 NOTE — Progress Notes (Signed)
 Date:  05/10/2024   Name:  Sheri Green   DOB:  1946-01-16   MRN:  [id number]   Chief Complaint: Hypertension and Diabetes  Hypertension This is a chronic problem. The problem is controlled. Pertinent negatives include no chest pain, headaches, palpitations or shortness of breath.  Diabetes Pertinent negatives for hypoglycemia include no headaches or tremors. Pertinent negatives for diabetes include no chest pain, no fatigue, no polydipsia and no polyuria.    Review of Systems  Constitutional:  Negative for appetite change, fatigue, fever and unexpected weight change.  HENT:  Negative for tinnitus and trouble swallowing.   Eyes:  Negative for visual disturbance.  Respiratory:  Negative for cough, chest tightness and shortness of breath.   Cardiovascular:  Negative for chest pain, palpitations and leg swelling.  Gastrointestinal:  Negative for abdominal pain.  Endocrine: Negative for polydipsia and polyuria.  Genitourinary:  Negative for dysuria and hematuria.  Musculoskeletal:  Negative for arthralgias.  Neurological:  Negative for tremors, numbness and headaches.  Psychiatric/Behavioral:  Negative for dysphoric mood.      Lab Results  Component Value Date   NA 143 01/13/2024   K 4.1 01/13/2024   CO2 20 01/13/2024   GLUCOSE 104 (H) 01/13/2024   BUN 16 01/13/2024   CREATININE 0.74 01/13/2024   CALCIUM  9.4 01/13/2024   EGFR 84 01/13/2024   Lab Results  Component Value Date   CHOL 156 01/13/2024   HDL 73 01/13/2024   LDLCALC 61 01/13/2024   TRIG 126 01/13/2024   CHOLHDL 2.1 01/13/2024   Lab Results  Component Value Date   TSH 3.020 04/28/2022   Lab Results  Component Value Date   HGBA1C 5.9 (H) 01/13/2024   Lab Results  Component Value Date   WBC 4.2 01/13/2024   HGB 12.0 01/13/2024   HCT 38.2 01/13/2024   MCV 87 01/13/2024   PLT 268 01/13/2024   Lab Results  Component Value Date   ALT 18 01/13/2024   AST 24 01/13/2024   ALKPHOS 59 01/13/2024    BILITOT 0.4 01/13/2024   No results found for: MARIEN BOLLS, VD25OH   Patient Active Problem List   Diagnosis Date Noted   Lumbar spondylosis 01/12/2023   Lumbar degenerative disc disease 01/12/2023   Chronic radicular lumbar pain 01/12/2023   Major depressive disorder with single episode, in partial remission 12/22/2022   Multilevel lumbosacral spondylosis with radiculopathy 08/31/2022   Urinary incontinence 04/27/2022   Sciatica of left side 04/27/2022   Primary open angle glaucoma (POAG) of both eyes, moderate stage 04/27/2022   Barrett's esophagus 12/24/2021   Type II diabetes mellitus with complication (HCC) 12/25/2020   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   Spinal stenosis, lumbar region, 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   Hyperlipidemia associated with type 2 diabetes mellitus (HCC) 08/17/1998   Essential hypertension 08/17/1988   Insomnia 08/17/1986    Allergies  Allergen Reactions   Diltiazem Hives    Past Surgical History:  Procedure Laterality Date   ABDOMINAL HYSTERECTOMY  1994   BARIATRIC SURGERY  2015   gastric sleeve   ESOPHAGOGASTRODUODENOSCOPY (EGD) WITH PROPOFOL  N/A 09/07/2022   Procedure: ESOPHAGOGASTRODUODENOSCOPY (EGD) WITH PROPOFOL ;  Surgeon: Jinny Carmine, MD;  Location: [facility];  Service: Endoscopy;  Laterality: N/A;   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   UPPER GI ENDOSCOPY  2023    Social History   Tobacco Use   Smoking status: Never   Smokeless tobacco: Never  Substance Use Topics   Alcohol use: Yes    Comment: rare   Drug use: No     Medication list has been reviewed and updated.  Current Meds  Medication Sig   atorvastatin  (LIPITOR) 40 MG tablet TAKE 1 TABLET BY MOUTH DAILY   Blood Glucose Monitoring Suppl DEVI 1 each by Does not apply route in the morning, at noon, and at bedtime. May substitute to any manufacturer covered by patient's insurance.    buPROPion  (WELLBUTRIN  XL) 300 MG 24 hr tablet Take 1 tablet (300 mg total) by mouth daily.   escitalopram  (LEXAPRO ) 10 MG tablet TAKE 1 TABLET BY MOUTH DAILY   Glucose Blood (BLOOD GLUCOSE TEST STRIPS) STRP 1 each by Does not apply route 2 (two) times daily. May substitute to any manufacturer covered by patient's insurance.   Lancet Device MISC 1 each by Does not apply route in the morning, at noon, and at bedtime. May substitute to any manufacturer covered by patient's insurance.   Lancets Misc. MISC 1 each by Does not apply route in the morning, at noon, and at bedtime. May substitute to any manufacturer covered by patient's insurance.   latanoprost (XALATAN) 0.005 % ophthalmic solution 1 drop at bedtime.   meloxicam  (MOBIC ) 7.5 MG tablet Take 1 tablet (7.5 mg total) by mouth daily as needed for pain. Take with food.   metFORMIN  (GLUCOPHAGE ) 500 MG tablet TAKE 1 TABLET BY MOUTH TWICE  DAILY WITH MEALS   pantoprazole  (PROTONIX ) 40 MG tablet Take 1 tablet (40 mg total) by mouth daily.   QUEtiapine  (SEROQUEL ) 25 MG tablet TAKE 1 TABLET BY MOUTH AT  BEDTIME AS NEEDED   Semaglutide ,0.25 or 0.5MG /DOS, (OZEMPIC , 0.25 OR 0.5 MG/DOSE,) 2 MG/3ML SOPN INJECT SUBCUTANEOUSLY 0.5 MG  EVERY WEEK   UNABLE TO FIND Med Name: Nature's Jenise Ruddle and Fruit Supplement       05/10/2024    3:02 PM 02/16/2024    2:30 PM 09/01/2023    3:06 PM 05/06/2023    9:59 AM  GAD 7 : Generalized Anxiety Score  Nervous, Anxious, on Edge 1 2 2 1   Control/stop worrying 1 0 1 1  Worry too much - different things 1 1 1 1   Trouble relaxing 1 2 3 1   Restless 1 2 1 1   Easily annoyed or irritable 0 0 1 0  Afraid - awful might happen 0 1 0 0  Total GAD 7 Score 5 8 9 5   Anxiety Difficulty Not difficult at all Somewhat difficult Very difficult Somewhat difficult       05/10/2024    3:02 PM 02/16/2024    2:30 PM 01/14/2024    4:02 PM  Depression screen PHQ 2/9  Decreased Interest 0 0 0  Down, Depressed, Hopeless 1 2 0  PHQ -  2 Score 1 2 0  Altered sleeping 1 2 0  Tired, decreased energy 1 2 0  Change in appetite 0 0 0  Feeling bad or failure about yourself  0 0 0  Trouble concentrating 0 0 0  Moving slowly or fidgety/restless 0 2 0  Suicidal thoughts 0 0 0  PHQ-9 Score 3 8 0  Difficult doing work/chores Not difficult at all Not difficult at all Not difficult at all    BP Readings from Last 3 Encounters:  05/10/24 112/60  02/16/24 122/78  01/14/24 120/76    Physical Exam Vitals and nursing note reviewed.  Constitutional:      General: She is not in acute  distress.    Appearance: Normal appearance. She is well-developed.  HENT:     Head: Normocephalic and atraumatic.  Cardiovascular:     Rate and Rhythm: Normal rate and regular rhythm.     Heart sounds: No murmur heard. Pulmonary:     Effort: Pulmonary effort is normal. No respiratory distress.     Breath sounds: No wheezing or rhonchi.  Musculoskeletal:     Cervical back: Normal range of motion.     Right lower leg: No edema.     Left lower leg: No edema.  Skin:    General: Skin is warm and dry.     Capillary Refill: Capillary refill takes less than 2 seconds.     Findings: No rash.  Neurological:     General: No focal deficit present.     Mental Status: She is alert and oriented to person, place, and time.  Psychiatric:        Mood and Affect: Mood normal.        Behavior: Behavior normal.    Diabetic Foot Exam - Simple   Simple Foot Form Diabetic Foot exam was performed with the following findings: Yes 05/10/2024  3:21 PM  Visual Inspection No deformities, no ulcerations, no other skin breakdown bilaterally: Yes Sensation Testing Intact to touch and monofilament testing bilaterally: Yes Pulse Check Posterior Tibialis and Dorsalis pulse intact bilaterally: Yes Comments      Wt Readings from Last 3 Encounters:  05/10/24 170 lb (77.1 kg)  02/16/24 172 lb (78 kg)  01/14/24 183 lb (83 kg)    BP 112/60   Pulse 96   Ht 5' 7  (1.702 m)   Wt 170 lb (77.1 kg)   SpO2 98%   BMI 26.63 kg/m   Assessment and Plan:  Problem List Items Addressed This Visit       Unprioritized   Essential hypertension - Primary (Chronic)   BP well controlled with lifestyle changes only.      Major depressive disorder with single episode, in partial remission (Chronic)   Clinically stable on Lexapro , Trazodone , Seroquel  and Bupropion .   No SI or HI on evaluation. Plan to continue same medications for now.       Type II diabetes mellitus with complication (HCC) (Chronic)   Blood sugars have been stable.  No hypoglycemic events since last visit. Currently medications are Ozempic  0.5 mg weekly and MTF. Last visit medical regimen changes were to reduce ozempic  dose. Lab Results  Component Value Date   HGBA1C 5.9 (H) 01/13/2024  A1C today =  5.5.  will continue Ozempic  0.5 mg weekly.       Relevant Medications   Blood Glucose Monitoring Suppl DEVI   Glucose Blood (BLOOD GLUCOSE TEST STRIPS) STRP   Lancet Device MISC   Lancets Misc. MISC   Other Visit Diagnoses       Long-term current use of injectable noninsulin antidiabetic medication           Return in about 4 months (around 09/09/2024) for TOC - DM, HTN   Dr. Lemon.    Leita HILARIO Adie, MD [facility] [place]

## 2024-05-10 NOTE — Assessment & Plan Note (Signed)
 BP well controlled with lifestyle changes only.

## 2024-05-20 ENCOUNTER — Other Ambulatory Visit: Payer: Self-pay | Admitting: Internal Medicine

## 2024-05-20 DIAGNOSIS — F5101 Primary insomnia: Secondary | ICD-10-CM

## 2024-05-20 DIAGNOSIS — E118 Type 2 diabetes mellitus with unspecified complications: Secondary | ICD-10-CM

## 2024-05-23 NOTE — Telephone Encounter (Signed)
 Requested Prescriptions  Pending Prescriptions Disp Refills   Semaglutide ,0.25 or 0.5MG /DOS, (OZEMPIC , 0.25 OR 0.5 MG/DOSE,) 2 MG/3ML SOPN [Pharmacy Med Name: OZEMPIC   0.25MG  0.5MG  SOLUTION PEN-INJECTOR  PEN DOSE] 9 mL 1    Sig: INJECT SUBCUTANEOUSLY 0.5 MG  EVERY WEEK     Endocrinology:  Diabetes - GLP-1 Receptor Agonists - semaglutide  Failed - 05/23/2024  8:53 AM      Failed - HBA1C in normal range and within 180 days    Hemoglobin A1C  Date Value Ref Range Status  12/19/2021 5.8  Final   Hgb A1c MFr Bld  Date Value Ref Range Status  01/13/2024 5.9 (H) 4.8 - 5.6 % Final    Comment:             Prediabetes: 5.7 - 6.4          Diabetes: >6.4          Glycemic control for adults with diabetes: <7.0          Passed - Cr in normal range and within 360 days    Creatinine, Ser  Date Value Ref Range Status  01/13/2024 0.74 0.57 - 1.00 mg/dL Final         Passed - Valid encounter within last 6 months    Recent Outpatient Visits           1 week ago Essential hypertension   [place] [facility] at [facility], Leita DEL, MD   3 months ago Barrett's esophagus with dysplasia   [place] [facility] at [facility], Leita DEL, MD   4 months ago Type II diabetes mellitus with complication [facility])   [place] [facility] at [facility], Leita DEL, MD               QUEtiapine  (SEROQUEL ) 25 MG tablet [Pharmacy Med Name: QUEtiapine  Fumarate 25 MG Oral Tablet] 90 tablet 3    Sig: TAKE 1 TABLET BY MOUTH AT  BEDTIME AS NEEDED     Not Delegated - Psychiatry:  Antipsychotics - Second Generation (Atypical) - quetiapine  Failed - 05/23/2024  8:53 AM      Failed - This refill cannot be delegated      Failed - TSH in normal range and within 360 days    TSH  Date Value Ref Range Status  04/28/2022 3.020 0.450 - 4.500 uIU/mL Final         Failed - Lipid Panel in normal range within the last 12  months    Cholesterol, Total  Date Value Ref Range Status  01/13/2024 156 100 - 199 mg/dL Final   LDL Chol Calc (NIH)  Date Value Ref Range Status  01/13/2024 61 0 - 99 mg/dL Final   HDL  Date Value Ref Range Status  01/13/2024 73 >39 mg/dL Final   Triglycerides  Date Value Ref Range Status  01/13/2024 126 0 - 149 mg/dL Final         Passed - Completed PHQ-2 or PHQ-9 in the last 360 days      Passed - Last BP in normal range    BP Readings from Last 1 Encounters:  05/10/24 112/60         Passed - Last Heart Rate in normal range    Pulse Readings from Last 1 Encounters:  05/10/24 96         Passed - Valid encounter within last 6 months  Recent Outpatient Visits           1 week ago Essential hypertension   [place] [facility] at [facility], Leita DEL, MD   3 months ago Barrett's esophagus with dysplasia   [place] [facility] at [facility], Leita DEL, MD   4 months ago Type II diabetes mellitus with complication [facility])    Primary Care & Sports Medicine at [facility], Leita DEL, MD              Passed - CBC within normal limits and completed in the last 12 months    WBC  Date Value Ref Range Status  01/13/2024 4.2 3.4 - 10.8 x10E3/uL Final   RBC  Date Value Ref Range Status  01/13/2024 4.37 3.77 - 5.28 x10E6/uL Final  12/19/2021 4.42 3.87 - 5.11 Final   Hemoglobin  Date Value Ref Range Status  01/13/2024 12.0 11.1 - 15.9 g/dL Final   Hematocrit  Date Value Ref Range Status  01/13/2024 38.2 34.0 - 46.6 % Final   MCHC  Date Value Ref Range Status  01/13/2024 31.4 (L) 31.5 - 35.7 g/dL Final   [facility]  Date Value Ref Range Status  01/13/2024 27.5 26.6 - 33.0 pg Final   MCV  Date Value Ref Range Status  01/13/2024 87 79 - 97 fL Final   No results found for: PLTCOUNTKUC, LABPLAT, POCPLA RDW  Date Value Ref Range Status  01/13/2024 12.9 11.7 - 15.4 %  Final         Passed - CMP within normal limits and completed in the last 12 months    Albumin  Date Value Ref Range Status  01/13/2024 4.7 3.8 - 4.8 g/dL Final   Alkaline Phosphatase  Date Value Ref Range Status  01/13/2024 59 44 - 121 IU/L Final   ALT  Date Value Ref Range Status  01/13/2024 18 0 - 32 IU/L Final   AST  Date Value Ref Range Status  01/13/2024 24 0 - 40 IU/L Final   BUN  Date Value Ref Range Status  01/13/2024 16 8 - 27 mg/dL Final   Calcium   Date Value Ref Range Status  01/13/2024 9.4 8.7 - 10.3 mg/dL Final   CO2  Date Value Ref Range Status  01/13/2024 20 20 - 29 mmol/L Final   Creatinine, Ser  Date Value Ref Range Status  01/13/2024 0.74 0.57 - 1.00 mg/dL Final   Glucose  Date Value Ref Range Status  01/13/2024 104 (H) 70 - 99 mg/dL Final  94/94/7976 879  Final   Glucose-Capillary  Date Value Ref Range Status  09/07/2022 112 (H) 70 - 99 mg/dL Final    Comment:    Glucose reference range applies only to samples taken after fasting for at least 8 hours.   Potassium  Date Value Ref Range Status  01/13/2024 4.1 3.5 - 5.2 mmol/L Final   Sodium  Date Value Ref Range Status  01/13/2024 143 134 - 144 mmol/L Final   Bilirubin Total  Date Value Ref Range Status  01/13/2024 0.4 0.0 - 1.2 mg/dL Final   Total Protein  Date Value Ref Range Status  01/13/2024 6.6 6.0 - 8.5 g/dL Final   eGFR  Date Value Ref Range Status  01/13/2024 84 >59 mL/min/1.73 Final

## 2024-05-23 NOTE — Telephone Encounter (Signed)
 Requested medication (s) are due for refill today: yes  Requested medication (s) are on the active medication list: yes  Last refill:  03/03/24  Future visit scheduled: yes  Notes to clinic:  Unable to refill per protocol, cannot delegate.      Requested Prescriptions  Pending Prescriptions Disp Refills   QUEtiapine  (SEROQUEL ) 25 MG tablet [Pharmacy Med Name: QUEtiapine  Fumarate 25 MG Oral Tablet] 90 tablet 3    Sig: TAKE 1 TABLET BY MOUTH AT  BEDTIME AS NEEDED     Not Delegated - Psychiatry:  Antipsychotics - Second Generation (Atypical) - quetiapine  Failed - 05/23/2024  8:53 AM      Failed - This refill cannot be delegated      Failed - TSH in normal range and within 360 days    TSH  Date Value Ref Range Status  04/28/2022 3.020 0.450 - 4.500 uIU/mL Final         Failed - Lipid Panel in normal range within the last 12 months    Cholesterol, Total  Date Value Ref Range Status  01/13/2024 156 100 - 199 mg/dL Final   LDL Chol Calc (NIH)  Date Value Ref Range Status  01/13/2024 61 0 - 99 mg/dL Final   HDL  Date Value Ref Range Status  01/13/2024 73 >39 mg/dL Final   Triglycerides  Date Value Ref Range Status  01/13/2024 126 0 - 149 mg/dL Final         Passed - Completed PHQ-2 or PHQ-9 in the last 360 days      Passed - Last BP in normal range    BP Readings from Last 1 Encounters:  05/10/24 112/60         Passed - Last Heart Rate in normal range    Pulse Readings from Last 1 Encounters:  05/10/24 96         Passed - Valid encounter within last 6 months    Recent Outpatient Visits           1 week ago Essential hypertension   [place] [facility] at [facility], Leita DEL, MD   3 months ago Barrett's esophagus with dysplasia   [place] [facility] at [facility], Leita DEL, MD   4 months ago Type II diabetes mellitus with complication [facility])   [place] [facility] Medicine at  [facility], Leita DEL, MD              Passed - CBC within normal limits and completed in the last 12 months    WBC  Date Value Ref Range Status  01/13/2024 4.2 3.4 - 10.8 x10E3/uL Final   RBC  Date Value Ref Range Status  01/13/2024 4.37 3.77 - 5.28 x10E6/uL Final  12/19/2021 4.42 3.87 - 5.11 Final   Hemoglobin  Date Value Ref Range Status  01/13/2024 12.0 11.1 - 15.9 g/dL Final   Hematocrit  Date Value Ref Range Status  01/13/2024 38.2 34.0 - 46.6 % Final   MCHC  Date Value Ref Range Status  01/13/2024 31.4 (L) 31.5 - 35.7 g/dL Final   [facility]  Date Value Ref Range Status  01/13/2024 27.5 26.6 - 33.0 pg Final   MCV  Date Value Ref Range Status  01/13/2024 87 79 - 97 fL Final   No results found for: PLTCOUNTKUC, LABPLAT, POCPLA RDW  Date Value Ref Range Status  01/13/2024 12.9 11.7 - 15.4 %  Final         Passed - CMP within normal limits and completed in the last 12 months    Albumin  Date Value Ref Range Status  01/13/2024 4.7 3.8 - 4.8 g/dL Final   Alkaline Phosphatase  Date Value Ref Range Status  01/13/2024 59 44 - 121 IU/L Final   ALT  Date Value Ref Range Status  01/13/2024 18 0 - 32 IU/L Final   AST  Date Value Ref Range Status  01/13/2024 24 0 - 40 IU/L Final   BUN  Date Value Ref Range Status  01/13/2024 16 8 - 27 mg/dL Final   Calcium   Date Value Ref Range Status  01/13/2024 9.4 8.7 - 10.3 mg/dL Final   CO2  Date Value Ref Range Status  01/13/2024 20 20 - 29 mmol/L Final   Creatinine, Ser  Date Value Ref Range Status  01/13/2024 0.74 0.57 - 1.00 mg/dL Final   Glucose  Date Value Ref Range Status  01/13/2024 104 (H) 70 - 99 mg/dL Final  94/94/7976 879  Final   Glucose-Capillary  Date Value Ref Range Status  09/07/2022 112 (H) 70 - 99 mg/dL Final    Comment:    Glucose reference range applies only to samples taken after fasting for at least 8 hours.   Potassium  Date Value Ref Range Status  01/13/2024  4.1 3.5 - 5.2 mmol/L Final   Sodium  Date Value Ref Range Status  01/13/2024 143 134 - 144 mmol/L Final   Bilirubin Total  Date Value Ref Range Status  01/13/2024 0.4 0.0 - 1.2 mg/dL Final   Total Protein  Date Value Ref Range Status  01/13/2024 6.6 6.0 - 8.5 g/dL Final   eGFR  Date Value Ref Range Status  01/13/2024 84 >59 mL/min/1.73 Final         Signed Prescriptions Disp Refills   Semaglutide ,0.25 or 0.5MG /DOS, (OZEMPIC , 0.25 OR 0.5 MG/DOSE,) 2 MG/3ML SOPN 9 mL 1    Sig: INJECT SUBCUTANEOUSLY 0.5 MG  EVERY WEEK     Endocrinology:  Diabetes - GLP-1 Receptor Agonists - semaglutide  Failed - 05/23/2024  8:53 AM      Failed - HBA1C in normal range and within 180 days    Hemoglobin A1C  Date Value Ref Range Status  12/19/2021 5.8  Final   Hgb A1c MFr Bld  Date Value Ref Range Status  01/13/2024 5.9 (H) 4.8 - 5.6 % Final    Comment:             Prediabetes: 5.7 - 6.4          Diabetes: >6.4          Glycemic control for adults with diabetes: <7.0          Passed - Cr in normal range and within 360 days    Creatinine, Ser  Date Value Ref Range Status  01/13/2024 0.74 0.57 - 1.00 mg/dL Final         Passed - Valid encounter within last 6 months    Recent Outpatient Visits           1 week ago Essential hypertension   [place] [facility] at [facility], Leita DEL, MD   3 months ago Barrett's esophagus with dysplasia   [facility] at [facility], Leita DEL, MD   4 months ago Type II diabetes mellitus with complication [facility])   [place] [facility] at [facility], Leita DEL, MD

## 2024-05-29 ENCOUNTER — Telehealth: Payer: Self-pay

## 2024-05-29 NOTE — Telephone Encounter (Signed)
 Copied from CRM [id number]. Topic: Clinical - Medication Question >> May 29, 2024 11:26 AM Wyona SQUIBB wrote: Reason for CRM: Warrick from [facility] called in to request and verify if pt is taking insulin   Callback [phone] - direct line

## 2024-05-30 NOTE — Telephone Encounter (Signed)
 Informed pt is not on insulin.

## 2024-06-01 ENCOUNTER — Other Ambulatory Visit: Payer: Self-pay | Admitting: Internal Medicine

## 2024-06-02 NOTE — Telephone Encounter (Signed)
 Requested Prescriptions  Pending Prescriptions Disp Refills   metFORMIN  (GLUCOPHAGE ) 500 MG tablet [Pharmacy Med Name: metFORMIN  HCl 500 MG Oral Tablet] 180 tablet 0    Sig: TAKE 1 TABLET BY MOUTH TWICE  DAILY WITH MEALS     Endocrinology:  Diabetes - Biguanides Failed - 06/02/2024  2:14 PM      Failed - B12 Level in normal range and within 720 days    No results found for: VITAMINB12       Passed - Cr in normal range and within 360 days    Creatinine, Ser  Date Value Ref Range Status  01/13/2024 0.74 0.57 - 1.00 mg/dL Final         Passed - HBA1C is between 0 and 7.9 and within 180 days    Hemoglobin A1C  Date Value Ref Range Status  12/19/2021 5.8  Final   Hgb A1c MFr Bld  Date Value Ref Range Status  01/13/2024 5.9 (H) 4.8 - 5.6 % Final    Comment:             Prediabetes: 5.7 - 6.4          Diabetes: >6.4          Glycemic control for adults with diabetes: <7.0          Passed - eGFR in normal range and within 360 days    eGFR  Date Value Ref Range Status  01/13/2024 84 >59 mL/min/1.73 Final         Passed - Valid encounter within last 6 months    Recent Outpatient Visits           3 weeks ago Essential hypertension   [place] [facility] at [facility], Leita DEL, MD   3 months ago Barrett's esophagus with dysplasia   [place] [facility] at [facility], Leita DEL, MD   4 months ago Type II diabetes mellitus with complication [facility])    Primary Care & Sports Medicine at [facility], Leita DEL, MD              Passed - CBC within normal limits and completed in the last 12 months    WBC  Date Value Ref Range Status  01/13/2024 4.2 3.4 - 10.8 x10E3/uL Final   RBC  Date Value Ref Range Status  01/13/2024 4.37 3.77 - 5.28 x10E6/uL Final  12/19/2021 4.42 3.87 - 5.11 Final   Hemoglobin  Date Value Ref Range Status  01/13/2024 12.0 11.1 - 15.9 g/dL Final    Hematocrit  Date Value Ref Range Status  01/13/2024 38.2 34.0 - 46.6 % Final   MCHC  Date Value Ref Range Status  01/13/2024 31.4 (L) 31.5 - 35.7 g/dL Final   [facility]  Date Value Ref Range Status  01/13/2024 27.5 26.6 - 33.0 pg Final   MCV  Date Value Ref Range Status  01/13/2024 87 79 - 97 fL Final   No results found for: PLTCOUNTKUC, LABPLAT, POCPLA RDW  Date Value Ref Range Status  01/13/2024 12.9 11.7 - 15.4 % Final

## 2024-07-03 ENCOUNTER — Other Ambulatory Visit: Payer: Self-pay | Admitting: Internal Medicine

## 2024-07-03 DIAGNOSIS — F324 Major depressive disorder, single episode, in partial remission: Secondary | ICD-10-CM

## 2024-07-06 NOTE — Telephone Encounter (Signed)
 Too soon for refill.  Requested Prescriptions  Pending Prescriptions Disp Refills   escitalopram  (LEXAPRO ) 10 MG tablet [Pharmacy Med Name: Escitalopram  Oxalate 10 MG Oral Tablet] 90 tablet 3    Sig: TAKE 1 TABLET BY MOUTH DAILY     Psychiatry:  Antidepressants - SSRI Passed - 07/06/2024  1:42 PM      Passed - Completed PHQ-2 or PHQ-9 in the last 360 days      Passed - Valid encounter within last 6 months    Recent Outpatient Visits           1 month ago Essential hypertension   [place] [facility] at [facility], Leita DEL, MD   4 months ago Barrett's esophagus with dysplasia   [facility] at [facility], Leita DEL, MD   5 months ago Type II diabetes mellitus with complication [facility])   [place] [facility] Medicine at [facility], Leita DEL, MD

## 2024-07-31 ENCOUNTER — Other Ambulatory Visit: Payer: Self-pay | Admitting: Internal Medicine

## 2024-07-31 DIAGNOSIS — K22719 Barrett's esophagus with dysplasia, unspecified: Secondary | ICD-10-CM

## 2024-08-03 NOTE — Telephone Encounter (Signed)
 Requested Prescriptions  Pending Prescriptions Disp Refills   pantoprazole  (PROTONIX ) 40 MG tablet [Pharmacy Med Name: Pantoprazole  Sodium 40 MG Oral Tablet Delayed Release] 90 tablet 1    Sig: TAKE 1 TABLET BY MOUTH DAILY     Gastroenterology: Proton Pump Inhibitors Passed - 08/03/2024 10:54 AM      Passed - Valid encounter within last 12 months    Recent Outpatient Visits           2 months ago Essential hypertension   [place] [facility] at [facility], Leita DEL, MD   5 months ago Barrett's esophagus with dysplasia   [facility] at [facility], Leita DEL, MD   6 months ago Type II diabetes mellitus with complication [facility])   [place] [facility] at [facility], Leita DEL, MD

## 2024-08-08 ENCOUNTER — Other Ambulatory Visit: Payer: Self-pay | Admitting: Internal Medicine

## 2024-08-09 NOTE — Telephone Encounter (Signed)
 Requested Prescriptions  Pending Prescriptions Disp Refills   metFORMIN  (GLUCOPHAGE ) 500 MG tablet [Pharmacy Med Name: metFORMIN  HCl 500 MG Oral Tablet] 180 tablet 0    Sig: TAKE 1 TABLET BY MOUTH TWICE  DAILY WITH MEALS     Endocrinology:  Diabetes - Biguanides Failed - 08/09/2024  4:26 PM      Failed - HBA1C is between 0 and 7.9 and within 180 days    Hemoglobin A1C  Date Value Ref Range Status  12/19/2021 5.8  Final   Hgb A1c MFr Bld  Date Value Ref Range Status  01/13/2024 5.9 (H) 4.8 - 5.6 % Final    Comment:             Prediabetes: 5.7 - 6.4          Diabetes: >6.4          Glycemic control for adults with diabetes: <7.0          Failed - B12 Level in normal range and within 720 days    No results found for: VITAMINB12       Passed - Cr in normal range and within 360 days    Creatinine, Ser  Date Value Ref Range Status  01/13/2024 0.74 0.57 - 1.00 mg/dL Final         Passed - eGFR in normal range and within 360 days    eGFR  Date Value Ref Range Status  01/13/2024 84 >59 mL/min/1.73 Final         Passed - Valid encounter within last 6 months    Recent Outpatient Visits           3 months ago Essential hypertension   [place] [facility] at [facility], Leita DEL, MD   5 months ago Barrett's esophagus with dysplasia   [place] [facility] at [facility], Leita DEL, MD   6 months ago Type II diabetes mellitus with complication [facility])   [place] [facility] at [facility], Leita DEL, MD              Passed - CBC within normal limits and completed in the last 12 months    WBC  Date Value Ref Range Status  01/13/2024 4.2 3.4 - 10.8 x10E3/uL Final   RBC  Date Value Ref Range Status  01/13/2024 4.37 3.77 - 5.28 x10E6/uL Final  12/19/2021 4.42 3.87 - 5.11 Final   Hemoglobin  Date Value Ref Range Status  01/13/2024 12.0 11.1 - 15.9 g/dL Final    Hematocrit  Date Value Ref Range Status  01/13/2024 38.2 34.0 - 46.6 % Final   MCHC  Date Value Ref Range Status  01/13/2024 31.4 (L) 31.5 - 35.7 g/dL Final   [facility]  Date Value Ref Range Status  01/13/2024 27.5 26.6 - 33.0 pg Final   MCV  Date Value Ref Range Status  01/13/2024 87 79 - 97 fL Final   No results found for: PLTCOUNTKUC, LABPLAT, POCPLA RDW  Date Value Ref Range Status  01/13/2024 12.9 11.7 - 15.4 % Final

## 2024-08-17 ENCOUNTER — Other Ambulatory Visit: Payer: Self-pay | Admitting: Internal Medicine

## 2024-08-18 NOTE — Telephone Encounter (Signed)
 Requested Prescriptions  Pending Prescriptions Disp Refills   buPROPion  (WELLBUTRIN  XL) 300 MG 24 hr tablet [Pharmacy Med Name: buPROPion  HCl ER (XL) 300 MG Oral Tablet Extended Release 24 Hour] 90 tablet 3    Sig: TAKE 1 TABLET BY MOUTH DAILY     Psychiatry: Antidepressants - bupropion  Passed - 08/18/2024 12:02 PM      Passed - Cr in normal range and within 360 days    Creatinine, Ser  Date Value Ref Range Status  01/13/2024 0.74 0.57 - 1.00 mg/dL Final         Passed - AST in normal range and within 360 days    AST  Date Value Ref Range Status  01/13/2024 24 0 - 40 IU/L Final         Passed - ALT in normal range and within 360 days    ALT  Date Value Ref Range Status  01/13/2024 18 0 - 32 IU/L Final         Passed - Completed PHQ-2 or PHQ-9 in the last 360 days      Passed - Last BP in normal range    BP Readings from Last 1 Encounters:  05/10/24 112/60         Passed - Valid encounter within last 6 months    Recent Outpatient Visits           3 months ago Essential hypertension   [place] [facility] at [facility], Leita DEL, MD   6 months ago Barrett's esophagus with dysplasia   [facility] at [facility], Leita DEL, MD   7 months ago Type II diabetes mellitus with complication [facility])   [place] [facility] at [facility], Leita DEL, MD

## 2024-09-15 ENCOUNTER — Ambulatory Visit: Admitting: Student

## 2024-09-22 ENCOUNTER — Ambulatory Visit: Admitting: Student

## 2024-09-22 ENCOUNTER — Encounter: Payer: Self-pay | Admitting: Student

## 2024-09-22 VITALS — BP 120/78 | HR 97 | Temp 98.3°F | Ht 67.0 in | Wt 167.0 lb

## 2024-09-22 DIAGNOSIS — R350 Frequency of micturition: Secondary | ICD-10-CM

## 2024-09-22 LAB — POCT URINALYSIS DIPSTICK
Bilirubin, UA: NEGATIVE
Blood, UA: NEGATIVE
Glucose, UA: NEGATIVE
Ketones, UA: POSITIVE
Nitrite, UA: NEGATIVE
Protein, UA: NEGATIVE
Spec Grav, UA: 1.015
Urobilinogen, UA: 0.2 U/dL
pH, UA: 6

## 2024-09-22 MED ORDER — CEPHALEXIN 500 MG PO CAPS: 500.0000 mg | ORAL_CAPSULE | Freq: Two times a day (BID) | ORAL | 0 refills | Status: AC

## 2024-09-22 NOTE — Progress Notes (Unsigned)
 "  Established Patient Office Visit  Subjective   Patient ID: TELESA JEANCHARLES, female    DOB: May 28, 1946  Age: 79 y.o. MRN: [id number]  Chief Complaint  Patient presents with   Urinary Frequency    Took an at home UTI test this morning, came back positive with leukocytes, back pain related to the urinary frequency    JHERI MITTER is a 79 y.o. person with medical hx listed below who presents today for dysuria. Increased frequency for the last 3 days. Checked home urine test was positive for leukocytes and negative for nitrates. Denies fever, hematuria, flank pain, no rash, vaginal symptoms.   Patient Active Problem List   Diagnosis Date Noted   Lumbar spondylosis 01/12/2023   Lumbar degenerative disc disease 01/12/2023   Chronic radicular lumbar pain 01/12/2023   Major depressive disorder with single episode, in partial remission 12/22/2022   Multilevel lumbosacral spondylosis with radiculopathy 08/31/2022   Urinary incontinence 04/27/2022   Sciatica of left side 04/27/2022   Primary open angle glaucoma (POAG) of both eyes, moderate stage 04/27/2022   Barrett's esophagus 12/24/2021   Type II diabetes mellitus with complication (HCC) 12/25/2020   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   Spinal stenosis, lumbar region, 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   Hyperlipidemia associated with type 2 diabetes mellitus (HCC) 08/17/1998   Essential hypertension 08/17/1988   Insomnia 08/17/1986      ROS Refer to HPI    Objective:     Outpatient Encounter Medications as of 09/22/2024  Medication Sig   atorvastatin  (LIPITOR) 40 MG tablet TAKE 1 TABLET BY MOUTH DAILY   Blood Glucose Monitoring Suppl DEVI 1 each by Does not apply route in the morning, at noon, and at bedtime. May substitute to any manufacturer covered by patient's insurance.   buPROPion  (WELLBUTRIN  XL) 300 MG 24 hr tablet TAKE 1 TABLET BY MOUTH DAILY   escitalopram   (LEXAPRO ) 10 MG tablet TAKE 1 TABLET BY MOUTH DAILY   latanoprost (XALATAN) 0.005 % ophthalmic solution 1 drop at bedtime.   meloxicam  (MOBIC ) 7.5 MG tablet Take 1 tablet (7.5 mg total) by mouth daily as needed for pain. Take with food.   metFORMIN  (GLUCOPHAGE ) 500 MG tablet TAKE 1 TABLET BY MOUTH TWICE  DAILY WITH MEALS   pantoprazole  (PROTONIX ) 40 MG tablet TAKE 1 TABLET BY MOUTH DAILY   QUEtiapine  (SEROQUEL ) 25 MG tablet TAKE 1 TABLET BY MOUTH AT  BEDTIME AS NEEDED   Semaglutide ,0.25 or 0.5MG /DOS, (OZEMPIC , 0.25 OR 0.5 MG/DOSE,) 2 MG/3ML SOPN INJECT SUBCUTANEOUSLY 0.5 MG  EVERY WEEK   UNABLE TO FIND Med Name: Nature's Bounty Vegitable and Fruit Supplement   No facility-administered encounter medications on file as of 09/22/2024.    BP 120/78   Pulse 97   Temp 98.3 F (36.8 C) (Oral)   Ht 5' 7 (1.702 m)   Wt 167 lb (75.8 kg)   SpO2 99%   BMI 26.16 kg/m  BP Readings from Last 3 Encounters:  09/22/24 120/78  05/10/24 112/60  02/16/24 122/78    Physical Exam Constitutional:      Appearance: Normal appearance.  HENT:     Mouth/Throat:     Mouth: Mucous membranes are moist.     Pharynx: Oropharynx is clear.  Cardiovascular:     Rate and Rhythm: Normal rate and regular rhythm.  Pulmonary:     Effort: Pulmonary effort is normal.     Breath sounds: No rhonchi or rales.  Abdominal:     General: Abdomen is flat. Bowel sounds are normal. There is no distension.     Palpations: Abdomen is soft.     Tenderness: There is no abdominal tenderness.  Musculoskeletal:        General: Normal range of motion.     Right lower leg: No edema.     Left lower leg: No edema.  Skin:    General: Skin is warm and dry.     Capillary Refill: Capillary refill takes less than 2 seconds.  Neurological:     General: No focal deficit present.     Mental Status: She is alert and oriented to person, place, and time.  Psychiatric:        Mood and Affect: Mood normal.        Behavior: Behavior normal.         09/22/2024    1:48 PM 05/10/2024    3:02 PM 02/16/2024    2:30 PM  Depression screen PHQ 2/9  Decreased Interest 0 0 0  Down, Depressed, Hopeless 0 1 2  PHQ - 2 Score 0 1 2  Altered sleeping  1 2  Tired, decreased energy  1 2  Change in appetite  0 0  Feeling bad or failure about yourself   0 0  Trouble concentrating  0 0  Moving slowly or fidgety/restless  0 2  Suicidal thoughts  0 0  PHQ-9 Score  3  8   Difficult doing work/chores  Not difficult at all Not difficult at all     Data saved with a previous flowsheet row definition       09/22/2024    1:48 PM 05/10/2024    3:02 PM 02/16/2024    2:30 PM 09/01/2023    3:06 PM  GAD 7 : Generalized Anxiety Score  Nervous, Anxious, on Edge 0 1  2  2    Control/stop worrying 0 1  0  1   Worry too much - different things  1  1  1    Trouble relaxing  1  2  3    Restless  1  2  1    Easily annoyed or irritable  0  0  1   Afraid - awful might happen  0  1  0   Total GAD 7 Score  5 8 9   Anxiety Difficulty  Not difficult at all Somewhat difficult Very difficult     Data saved with a previous flowsheet row definition    No results found for any visits on 09/22/24.  Last CBC Lab Results  Component Value Date   WBC 4.2 01/13/2024   HGB 12.0 01/13/2024   HCT 38.2 01/13/2024   MCV 87 01/13/2024   MCH 27.5 01/13/2024   RDW 12.9 01/13/2024   PLT 268 01/13/2024   Last metabolic panel Lab Results  Component Value Date   GLUCOSE 104 (H) 01/13/2024   NA 143 01/13/2024   K 4.1 01/13/2024   CL 105 01/13/2024   CO2 20 01/13/2024   BUN 16 01/13/2024   CREATININE 0.74 01/13/2024   EGFR 84 01/13/2024   CALCIUM  9.4 01/13/2024   PROT 6.6 01/13/2024   ALBUMIN 4.7 01/13/2024   LABGLOB 1.9 01/13/2024   AGRATIO 2.8 (H) 04/28/2022   BILITOT 0.4 01/13/2024   ALKPHOS 59 01/13/2024   AST 24 01/13/2024   ALT 18 01/13/2024   Last lipids Lab Results  Component Value Date   CHOL 156 01/13/2024   HDL 73  01/13/2024   LDLCALC 61  01/13/2024   TRIG 126 01/13/2024   CHOLHDL 2.1 01/13/2024   Last hemoglobin A1c Lab Results  Component Value Date   HGBA1C 5.9 (H) 01/13/2024      The 10-year ASCVD risk score (Arnett DK, et al., 2019) is: 28.5%    Assessment & Plan:  Urinary frequency -     POCT urinalysis dipstick     No follow-ups on file.    Harlene Saddler, MD "

## 2024-10-02 ENCOUNTER — Ambulatory Visit: Admitting: Student
